# Patient Record
Sex: Male | Born: 1950
Health system: Southern US, Community
[De-identification: ages and names within clinical notes are randomized; demographics above are authoritative.]

## PROBLEM LIST (undated history)

## (undated) DIAGNOSIS — M545 Low back pain, unspecified: Secondary | ICD-10-CM

## (undated) DIAGNOSIS — Z720 Tobacco use: Secondary | ICD-10-CM

## (undated) DIAGNOSIS — R55 Syncope and collapse: Secondary | ICD-10-CM

## (undated) DIAGNOSIS — I2102 ST elevation (STEMI) myocardial infarction involving left anterior descending coronary artery: Secondary | ICD-10-CM

## (undated) DIAGNOSIS — R51 Headache: Secondary | ICD-10-CM

## (undated) DIAGNOSIS — I5042 Chronic combined systolic (congestive) and diastolic (congestive) heart failure: Secondary | ICD-10-CM

## (undated) DIAGNOSIS — J189 Pneumonia, unspecified organism: Secondary | ICD-10-CM

## (undated) DIAGNOSIS — Z95820 Peripheral vascular angioplasty status with implants and grafts: Secondary | ICD-10-CM

## (undated) DIAGNOSIS — R519 Headache, unspecified: Secondary | ICD-10-CM

## (undated) DIAGNOSIS — K635 Polyp of colon: Secondary | ICD-10-CM

## (undated) DIAGNOSIS — Z9861 Coronary angioplasty status: Principal | ICD-10-CM

## (undated) DIAGNOSIS — R739 Hyperglycemia, unspecified: Secondary | ICD-10-CM

## (undated) DIAGNOSIS — N200 Calculus of kidney: Secondary | ICD-10-CM

## (undated) DIAGNOSIS — I255 Ischemic cardiomyopathy: Secondary | ICD-10-CM

## (undated) DIAGNOSIS — I472 Ventricular tachycardia, unspecified: Secondary | ICD-10-CM

## (undated) DIAGNOSIS — I209 Angina pectoris, unspecified: Secondary | ICD-10-CM

## (undated) DIAGNOSIS — I2510 Atherosclerotic heart disease of native coronary artery without angina pectoris: Secondary | ICD-10-CM

## (undated) DIAGNOSIS — M1990 Unspecified osteoarthritis, unspecified site: Secondary | ICD-10-CM

## (undated) DIAGNOSIS — E785 Hyperlipidemia, unspecified: Secondary | ICD-10-CM

## (undated) DIAGNOSIS — G8929 Other chronic pain: Secondary | ICD-10-CM

## (undated) DIAGNOSIS — I241 Dressler's syndrome: Secondary | ICD-10-CM

## (undated) HISTORY — PX: VASECTOMY: SHX75

## (undated) HISTORY — DX: Coronary angioplasty status: Z98.61

## (undated) HISTORY — PX: TRANSTHORACIC ECHOCARDIOGRAM: SHX275

## (undated) HISTORY — DX: Atherosclerotic heart disease of native coronary artery without angina pectoris: I25.10

## (undated) HISTORY — DX: ST elevation (STEMI) myocardial infarction involving left anterior descending coronary artery: I21.02

## (undated) HISTORY — DX: Ventricular tachycardia, unspecified: I47.20

## (undated) HISTORY — PX: INGUINAL HERNIA REPAIR: SUR1180

## (undated) HISTORY — DX: Hyperlipidemia, unspecified: E78.5

## (undated) HISTORY — DX: Polyp of colon: K63.5

## (undated) HISTORY — DX: Ventricular tachycardia: I47.2

---

## 2003-11-03 ENCOUNTER — Ambulatory Visit (HOSPITAL_COMMUNITY): Admission: RE | Admit: 2003-11-03 | Discharge: 2003-11-03 | Payer: Self-pay | Admitting: Gastroenterology

## 2003-11-03 ENCOUNTER — Encounter (INDEPENDENT_AMBULATORY_CARE_PROVIDER_SITE_OTHER): Payer: Self-pay | Admitting: *Deleted

## 2005-03-25 ENCOUNTER — Ambulatory Visit: Payer: Self-pay | Admitting: Oncology

## 2005-06-17 ENCOUNTER — Ambulatory Visit: Payer: Self-pay | Admitting: Oncology

## 2005-08-12 ENCOUNTER — Ambulatory Visit: Payer: Self-pay | Admitting: Oncology

## 2005-10-09 ENCOUNTER — Ambulatory Visit: Payer: Self-pay | Admitting: Oncology

## 2006-04-06 ENCOUNTER — Ambulatory Visit: Payer: Self-pay | Admitting: Oncology

## 2006-04-11 LAB — COMPREHENSIVE METABOLIC PANEL
ALT: 25 U/L (ref 0–40)
AST: 21 U/L (ref 0–37)
Albumin: 4.3 g/dL (ref 3.5–5.2)
Alkaline Phosphatase: 95 U/L (ref 39–117)
Calcium: 9.1 mg/dL (ref 8.4–10.5)
Creatinine, Ser: 1.1 mg/dL (ref 0.4–1.5)
Glucose, Bld: 106 mg/dL — ABNORMAL HIGH (ref 70–99)
Potassium: 4.3 mEq/L (ref 3.5–5.3)
Sodium: 138 mEq/L (ref 135–145)
Total Bilirubin: 0.3 mg/dL (ref 0.3–1.2)
Total Protein: 6.4 g/dL (ref 6.0–8.3)

## 2006-04-11 LAB — CHCC SMEAR

## 2006-04-11 LAB — CBC WITH DIFFERENTIAL/PLATELET
EOS%: 1.8 % (ref 0.0–7.0)
HCT: 39.9 % (ref 38.7–49.9)
MCH: 31.7 pg (ref 28.0–33.4)
MCV: 92.8 fL (ref 81.6–98.0)
NEUT#: 4.1 10*3/uL (ref 1.5–6.5)
NEUT%: 45.8 % (ref 40.0–75.0)
RBC: 4.3 10*6/uL (ref 4.20–5.71)
WBC: 8.9 10*3/uL (ref 4.0–10.0)

## 2006-04-11 LAB — LACTATE DEHYDROGENASE: LDH: 152 U/L (ref 94–250)

## 2010-07-02 ENCOUNTER — Encounter: Admission: RE | Admit: 2010-07-02 | Discharge: 2010-07-02 | Payer: Self-pay | Admitting: Gastroenterology

## 2010-07-08 LAB — HM COLONOSCOPY: HM Colonoscopy: NORMAL

## 2011-04-01 NOTE — Op Note (Signed)
NAME:  Roger [name], Roger [name]                       ACCOUNT NO.:  [id number]   MEDICAL RECORD NO.:  [id number]                   PATIENT TYPE:  AMB   LOCATION:  ENDO                                 FACILITY:  [facility]   PHYSICIAN:  Graylin Shiver, M.D.                DATE OF BIRTH:  [birth year]   DATE OF PROCEDURE:  11/03/2003  DATE OF DISCHARGE:                                 OPERATIVE REPORT   PROCEDURE PERFORMED:  Colonoscopy with biopsy.   INDICATIONS FOR PROCEDURE:  Screening.   Informed consent was obtained after explanation of the risks of bleeding,  infection, and perforation.   PREMEDICATIONS:  Fentanyl 75 mcg  IV, Versed 6 mg IV.   DESCRIPTION OF PROCEDURE:  With the patient in the left lateral decubitus  position, a rectal exam was performed and no masses were felt.  The Olympus  colonoscope was inserted into the rectum and advanced around the colon to  the cecum.  Cecal landmarks were identified.  The cecum and ascending colon  were normal.  The transverse colon normal.  The descending colon and sigmoid  were normal and in the proximal rectum there was a small 3 mm sessile polyp  removed with cold forceps.  The patient tolerated the procedure well without  complications.   IMPRESSION:  Rectal polyp diagnosis code 211.4.   PLAN:  The pathology will be checked.                                               Graylin Shiver, M.D.    Roger [name]  D:  11/03/2003  T:  11/04/2003  Job:  [id number]   cc:   Ernestina Penna, M.D.  [address] [place]  [place] [address]  Fax: [phone]

## 2012-04-01 ENCOUNTER — Encounter (HOSPITAL_COMMUNITY): Payer: Self-pay | Admitting: *Deleted

## 2012-04-01 ENCOUNTER — Emergency Department (HOSPITAL_COMMUNITY)
Admission: EM | Admit: 2012-04-01 | Discharge: 2012-04-01 | Disposition: A | Discharge status: Home / Self Care | Payer: BC Managed Care – PPO | Attending: Emergency Medicine | Admitting: Emergency Medicine

## 2012-04-01 ENCOUNTER — Emergency Department (HOSPITAL_COMMUNITY): Payer: BC Managed Care – PPO

## 2012-04-01 DIAGNOSIS — N1330 Unspecified hydronephrosis: Secondary | ICD-10-CM | POA: Insufficient documentation

## 2012-04-01 DIAGNOSIS — N200 Calculus of kidney: Secondary | ICD-10-CM

## 2012-04-01 DIAGNOSIS — R110 Nausea: Secondary | ICD-10-CM | POA: Insufficient documentation

## 2012-04-01 DIAGNOSIS — R109 Unspecified abdominal pain: Secondary | ICD-10-CM | POA: Insufficient documentation

## 2012-04-01 DIAGNOSIS — N201 Calculus of ureter: Secondary | ICD-10-CM | POA: Insufficient documentation

## 2012-04-01 DIAGNOSIS — R6883 Chills (without fever): Secondary | ICD-10-CM | POA: Insufficient documentation

## 2012-04-01 HISTORY — DX: Calculus of kidney: N20.0

## 2012-04-01 LAB — COMPREHENSIVE METABOLIC PANEL
BUN: 15 mg/dL (ref 6–23)
Calcium: 9.2 mg/dL (ref 8.4–10.5)
Chloride: 100 mEq/L (ref 96–112)
GFR calc non Af Amer: 74 mL/min — ABNORMAL LOW (ref >90)
Glucose, Bld: 153 mg/dL — ABNORMAL HIGH (ref 70–99)
Sodium: 136 mEq/L (ref 135–145)

## 2012-04-01 LAB — URINE MICROSCOPIC-ADD ON

## 2012-04-01 LAB — URINALYSIS, ROUTINE W REFLEX MICROSCOPIC
Ketones, ur: NEGATIVE mg/dL
Leukocytes, UA: NEGATIVE
Nitrite: NEGATIVE
Protein, ur: NEGATIVE mg/dL

## 2012-04-01 LAB — CBC
HCT: 39.7 % (ref 39.0–52.0)
MCHC: 34.8 g/dL (ref 30.0–36.0)
Platelets: 239 10*3/uL (ref 150–400)
RDW: 12.4 % (ref 11.5–15.5)

## 2012-04-01 MED ORDER — KETOROLAC TROMETHAMINE 30 MG/ML IJ SOLN
30.0000 mg | Freq: Once | INTRAMUSCULAR | Status: AC
  Administered 2012-04-01: 30 mg via INTRAVENOUS
  Filled 2012-04-01: qty 1

## 2012-04-01 MED ORDER — ONDANSETRON HCL 4 MG PO TABS: 4.0000 mg | ORAL_TABLET | Freq: Four times a day (QID) | ORAL | Status: AC | PRN

## 2012-04-01 MED ORDER — TAMSULOSIN HCL 0.4 MG PO CAPS: 0.4000 mg | ORAL_CAPSULE | Freq: Every day | ORAL | Status: DC

## 2012-04-01 MED ORDER — SODIUM CHLORIDE 0.9 % IV BOLUS (SEPSIS)
500.0000 mL | Freq: Once | INTRAVENOUS | Status: AC
  Administered 2012-04-01: 500 mL via INTRAVENOUS

## 2012-04-01 MED ORDER — IBUPROFEN 600 MG PO TABS: 600.0000 mg | ORAL_TABLET | Freq: Four times a day (QID) | ORAL | Status: AC | PRN

## 2012-04-01 MED ORDER — HYDROMORPHONE HCL PF 1 MG/ML IJ SOLN
1.0000 mg | Freq: Once | INTRAMUSCULAR | Status: AC
  Administered 2012-04-01: 1 mg via INTRAVENOUS
  Filled 2012-04-01: qty 1

## 2012-04-01 MED ORDER — OXYCODONE-ACETAMINOPHEN 5-325 MG PO TABS: 1.0000 | ORAL_TABLET | Freq: Four times a day (QID) | ORAL | Status: AC | PRN

## 2012-04-01 MED ORDER — ONDANSETRON HCL 4 MG/2ML IJ SOLN
4.0000 mg | Freq: Once | INTRAMUSCULAR | Status: AC
  Administered 2012-04-01: 4 mg via INTRAVENOUS
  Filled 2012-04-01: qty 2

## 2012-04-01 NOTE — ED Notes (Signed)
Patient states he was awaken with left flank pain . States he went to the batrhroom to urinate and that's when the pain started. States he had a kidney stone 22 yrs. Ago.

## 2012-04-01 NOTE — Discharge Instructions (Signed)
Please strain all of your urine to try to catch the stone. Follow-up with your urologist.  Follow-up with your regular doctor regarding the other CT findings as we discussed, specifically the liver.      Kidney Stones Kidney stones (ureteral lithiasis) are deposits that form inside your kidneys. The intense pain is caused by the stone moving through the urinary tract. When the stone moves, the ureter goes into spasm around the stone. The stone is usually passed in the urine.  CAUSES   A disorder that makes certain neck glands produce too much parathyroid hormone (primary hyperparathyroidism).   A buildup of uric acid crystals.   Narrowing (stricture) of the ureter.   A kidney obstruction present at birth (congenital obstruction).   Previous surgery on the kidney or ureters.   Numerous kidney infections.  SYMPTOMS   Feeling sick to your stomach (nauseous).   Throwing up (vomiting).   Blood in the urine (hematuria).   Pain that usually spreads (radiates) to the groin.   Frequency or urgency of urination.  DIAGNOSIS   Taking a history and physical exam.   Blood or urine tests.   Computerized X-ray scan (CT scan).   Occasionally, an examination of the inside of the urinary bladder (cystoscopy) is performed.  TREATMENT   Observation.   Increasing your fluid intake.   Surgery may be needed if you have severe pain or persistent obstruction.  The size, location, and chemical composition are all important variables that will determine the proper choice of action for you. Talk to your caregiver to better understand your situation so that you will minimize the risk of injury to yourself and your kidney.  HOME CARE INSTRUCTIONS   Drink enough water and fluids to keep your urine clear or pale yellow.   Strain all urine through the provided strainer. Keep all particulate matter and stones for your caregiver to see. The stone causing the pain may be as small as a grain of  salt. It is very important to use the strainer each and every time you pass your urine. The collection of your stone will allow your caregiver to analyze it and verify that a stone has actually passed.   Only take over-the-counter or prescription medicines for pain, discomfort, or fever as directed by your caregiver.   Make a follow-up appointment with your caregiver as directed.   Get follow-up X-rays if required. The absence of pain does not always mean that the stone has passed. It may have only stopped moving. If the urine remains completely obstructed, it can cause loss of kidney function or even complete destruction of the kidney. It is your responsibility to make sure X-rays and follow-ups are completed. Ultrasounds of the kidney can show blockages and the status of the kidney. Ultrasounds are not associated with any radiation and can be performed easily in a matter of minutes.  SEEK IMMEDIATE MEDICAL CARE IF:   Pain cannot be controlled with the prescribed medicine.   You have a fever.   The severity or intensity of pain increases over 18 hours and is not relieved by pain medicine.   You develop a new onset of abdominal pain.   You feel faint or pass out.  MAKE SURE YOU:   Understand these instructions.   Will watch your condition.   Will get help right away if you are not doing well or get worse.  Document Released: 10/31/2005 Document Revised: 10/20/2011 Document Reviewed: 02/26/2010 [facility] Patient Information 2012 [place], [place].

## 2012-04-01 NOTE — ED Notes (Signed)
Pt sts at 0400 got up to urinate and developed  Left lower abd and back pain.  Awaiting urine sample

## 2012-04-01 NOTE — ED Provider Notes (Signed)
History     CSN: [id number]  Arrival date & time 04/01/12  0605   First MD Initiated Contact with Patient 04/01/12 [phone]      Chief Complaint  Patient presents with  . Flank Pain    (Consider location/radiation/quality/duration/timing/severity/associated sxs/prior treatment) Patient is a 61 y.o. male presenting with flank pain. The history is provided by the patient.  Flank Pain This is a new problem.  Sudden onset while urinating this morning severe, sharp left flank pain, rad to left lower abd and left side of groin (intermittently). Pain described as pressure in the back. Hx kidney stone 22 years ago, feels similar. + nausea, chills. Neg fever, vomiting, dysuria, hematuria, penile discharge, testicular swelling, bowel habit change.  No aggravating or alleviating factors, no tx attempted PTA.  Past Medical History  Diagnosis Date  . Kidney calculus     History reviewed. No pertinent past surgical history.  History reviewed. No pertinent family history.  History  Substance Use Topics  . Smoking status: Current Everyday Smoker  . Smokeless tobacco: Not on file  . Alcohol Use: Yes      Review of Systems  Genitourinary: Positive for flank pain.  10 systems reviewed and are otherwise negative for acute change except as noted in the HPI.   Allergies  Review of patient's allergies indicates no known allergies.  Home Medications   Current Outpatient Rx  Name Route Sig Dispense Refill  . ASPIRIN 325 MG PO TABS Oral Take 325 mg by mouth daily.    Marland Kitchen SIMVASTATIN 40 MG PO TABS Oral Take 40 mg by mouth every evening.    Marland Kitchen VARENICLINE TARTRATE 1 MG PO TABS Oral Take 1 mg by mouth 2 (two) times daily.      BP 136/63  Pulse 89  Temp(Src) 97.8 F (36.6 C) (Oral)  Resp 20  SpO2 97%  Physical Exam  Nursing note reviewed. Constitutional: He appears well-developed and well-nourished. Distressed: uncomfortable appearing.       VS reviewed, sig for HTN  HENT:  Head:  Normocephalic and atraumatic.       Oral mucosa moist  Eyes: Conjunctivae are normal. Pupils are equal, round, and reactive to light.  Neck: Neck supple.  Cardiovascular: Normal rate, regular rhythm and normal heart sounds.        Bilateral radial and DP pulses are 2+   Pulmonary/Chest: Effort normal and breath sounds normal. No respiratory distress. He has no wheezes.  Abdominal: Soft. Bowel sounds are normal. He exhibits no distension. There is Tenderness: mild left-sided abd tenderness.. There is no guarding.       Slight left CVAT  Musculoskeletal: He exhibits no edema.  Neurological: He is alert.       MS appears baseline for pt and situation  Skin: Skin is warm and dry.    ED Course  Procedures (including critical care time)  Labs Reviewed  URINALYSIS, ROUTINE W REFLEX MICROSCOPIC - Abnormal; Notable for the following:    Hgb urine dipstick LARGE (*)    All other components within normal limits  CBC - Abnormal; Notable for the following:    WBC 11.1 (*)    All other components within normal limits  COMPREHENSIVE METABOLIC PANEL - Abnormal; Notable for the following:    Glucose, Bld 153 (*)    GFR calc non Af Amer 74 (*)    GFR calc Af Amer 86 (*)    All other components within normal limits  URINE MICROSCOPIC-ADD ON  Ct Abdomen Pelvis Wo Contrast  04/01/2012  *RADIOLOGY REPORT*  Clinical Data: Left-sided flank pain.  CT ABDOMEN AND PELVIS WITHOUT CONTRAST  Technique:  Multidetector CT imaging of the abdomen and pelvis was performed following the standard protocol without intravenous contrast.  Comparison: No priors.  Findings:  Lung Bases: Dependent atelectasis in the right lower lobe. Otherwise, unremarkable.  Abdomen/Pelvis:  Image 82 of series 2 demonstrates a 3 mm partially obstructive calculus in the distal third of the left ureter (immediately proximal to the left ureterovesicular junction) with mild proximal hydroureteronephrosis and extensive left-sided perinephric  stranding.  A 4 mm nonobstructive calculus is also noted in the lower pole collecting system of the right kidney.  The kidneys are otherwise unremarkable on this noncontrast examination.  A 7 mm low attenuation lesion is noted in segment 6 of the liver (image 24 series 2), which is too small to definitively characterize.  The unenhanced appearance of the low remainder of the liver, gallbladder, pancreas, spleen and bilateral adrenal glands is otherwise unremarkable.  Normal appendix.  No ascites or pneumoperitoneum and no pathologic distension of bowel.  There are a few colonic diverticula, without surrounding inflammatory changes to suggest acute diverticulitis at this time.  No definite pathologic adenopathy identified within the abdomen or pelvis on this noncontrast CT examination.  Atherosclerosis throughout the abdominal and pelvic vasculature, without definite aneurysm. Urinary bladder is unremarkable.  A large coarse calcification is noted in the central aspect of the prostate gland.  Musculoskeletal: There are bilateral pars defects at L5 with 11 mm of anterolisthesis of L5 upon S1. Additionally, there is a compression fracture of T11 which appears to be old with approximately 30% loss of anterior vertebral body height.  There are no aggressive appearing lytic or blastic lesions noted in the visualized portions of the skeleton.  IMPRESSION: 1.  3 mm partially obstructive calculus in the distal third of the left ureter with mild proximal hydroureteronephrosis and extensive left perinephric stranding. 2.  4 mm nonobstructive calculus in the lower pole collecting system of the right kidney. 3.  7 mm low attenuation lesion in segment 6 of the liver is too small to definitively characterize, particularly on this noncontrast CT examination.  However, this is statistically favored to represent a small cyst or biliary hamartoma. 4.  Grade II spondylolisthesis of L5-S1, as above. 5.  Mild colonic diverticulosis  without evidence to suggest acute diverticulitis. 6.  Atherosclerosis. 7.  Old compression fracture of T11 with approximately 30% loss of anterior vertebral body height.  Original Report Authenticated By: Florencia Reasons, M.D.     1. Kidney stone on left side       MDM  On initial examination, suspicion for ureteral colic but abd pathology to include diverticulitis is considered. Hemodynamically stable. Re-eval demonstrates no TTP in abdomen, but continued left flank pain making ureteral colic more likely, will proceed with CT abd/pelvis without contrast.      11:35 AM Please see CT scan result summary above. All findings have been discussed with pt and spouse. L5-S1 spondylolisthesis, diverticulosis, T11 compression fx are chronic and known to pt. Pain sig improved with medications in ED. No UTI. Will d.c home with pain, nausea, flomax rx. Urology f.u discussed, pt has seen Tannenbaum in the past.  Shaaron Adler, PA-C 04/01/12 1140

## 2012-04-03 NOTE — ED Provider Notes (Signed)
Medical screening examination/treatment/procedure(s) were performed by non-physician practitioner and as supervising physician I was immediately available for consultation/collaboration.  Geoffery Lyons, MD 04/03/12 [phone]

## 2013-02-11 ENCOUNTER — Other Ambulatory Visit (INDEPENDENT_AMBULATORY_CARE_PROVIDER_SITE_OTHER): Payer: BC Managed Care – PPO

## 2013-02-11 ENCOUNTER — Other Ambulatory Visit: Payer: Self-pay | Admitting: Family Medicine

## 2013-02-11 DIAGNOSIS — Z0000 Encounter for general adult medical examination without abnormal findings: Secondary | ICD-10-CM

## 2013-02-11 DIAGNOSIS — E559 Vitamin D deficiency, unspecified: Secondary | ICD-10-CM

## 2013-02-11 DIAGNOSIS — E291 Testicular hypofunction: Secondary | ICD-10-CM

## 2013-02-11 DIAGNOSIS — E782 Mixed hyperlipidemia: Secondary | ICD-10-CM

## 2013-02-11 DIAGNOSIS — Z79899 Other long term (current) drug therapy: Secondary | ICD-10-CM

## 2013-02-11 LAB — COMPLETE METABOLIC PANEL WITH GFR
AST: 13 U/L (ref 0–37)
Albumin: 3.8 g/dL (ref 3.5–5.2)
Alkaline Phosphatase: 99 U/L (ref 39–117)
BUN: 14 mg/dL (ref 6–23)
CO2: 25 mEq/L (ref 19–32)
GFR, Est African American: 80 mL/min
Glucose, Bld: 106 mg/dL — ABNORMAL HIGH (ref 70–99)
Sodium: 141 mEq/L (ref 135–145)
Total Protein: 5.9 g/dL — ABNORMAL LOW (ref 6.0–8.3)

## 2013-02-11 LAB — CBC WITH DIFFERENTIAL/PLATELET
Basophils Absolute: 0.1 10*3/uL (ref 0.0–0.1)
Hemoglobin: 14.8 g/dL (ref 13.0–17.0)
Lymphocytes Relative: 37 % (ref 12–46)
Lymphs Abs: 3.5 10*3/uL (ref 0.7–4.0)
MCH: 31.2 pg (ref 26.0–34.0)
MCHC: 34.5 g/dL (ref 30.0–36.0)
MCV: 90.3 fL (ref 78.0–100.0)
Monocytes Absolute: 0.8 10*3/uL (ref 0.1–1.0)
RDW: 13.3 % (ref 11.5–15.5)

## 2013-02-11 LAB — LIPID PANEL
LDL Cholesterol: 65 mg/dL (ref 0–99)
Total CHOL/HDL Ratio: 4.1 Ratio
VLDL: 43 mg/dL — ABNORMAL HIGH (ref 0–40)

## 2013-02-11 LAB — TSH: TSH: 3.445 u[IU]/mL (ref 0.350–4.500)

## 2013-02-11 LAB — TESTOSTERONE: Testosterone: 232 ng/dL — ABNORMAL LOW (ref 300–890)

## 2013-02-11 LAB — PSA: PSA: 0.6 ng/mL (ref <=4.00)

## 2013-02-22 ENCOUNTER — Ambulatory Visit (INDEPENDENT_AMBULATORY_CARE_PROVIDER_SITE_OTHER): Payer: BC Managed Care – PPO | Admitting: Family Medicine

## 2013-02-22 ENCOUNTER — Encounter: Payer: Self-pay | Admitting: Family Medicine

## 2013-02-22 VITALS — BP 132/74 | HR 66 | Temp 97.8°F | Resp 18 | Ht 70.0 in | Wt 214.0 lb

## 2013-02-22 DIAGNOSIS — K635 Polyp of colon: Secondary | ICD-10-CM | POA: Insufficient documentation

## 2013-02-22 DIAGNOSIS — Z720 Tobacco use: Secondary | ICD-10-CM | POA: Insufficient documentation

## 2013-02-22 DIAGNOSIS — E785 Hyperlipidemia, unspecified: Secondary | ICD-10-CM | POA: Insufficient documentation

## 2013-02-22 DIAGNOSIS — Z0000 Encounter for general adult medical examination without abnormal findings: Secondary | ICD-10-CM

## 2013-02-22 DIAGNOSIS — N200 Calculus of kidney: Secondary | ICD-10-CM | POA: Insufficient documentation

## 2013-02-22 NOTE — Progress Notes (Signed)
Subjective:    Patient ID: Roger [name], male    DOB: [birth year], 62 y.o.   MRN: [id number]  HPI  Patient is here for complete physical exam.  He reports chronic daily back pain which is due to spondylolysis in his L-spine.  He also has fatigue and deconditioning/shortness of breath due to smoking.  He is currently not interested in smoking cessation.  Past Medical History  Diagnosis Date  . Kidney calculus   . Hyperlipidemia   . Colon polyp   . Smoker    Current Outpatient Prescriptions on File Prior to Visit  Medication Sig Dispense Refill  . aspirin 325 MG tablet Take 325 mg by mouth daily.      . simvastatin (ZOCOR) 40 MG tablet Take 40 mg by mouth every evening.       No current facility-administered medications on file prior to visit.   No Known Allergies History   Social History  . Marital Status: Married    Spouse Name: N/A    Number of Children: N/A  . Years of Education: N/A   Occupational History  . Not on file.   Social History Main Topics  . Smoking status: Current Every Day Smoker  . Smokeless tobacco: Never Used  . Alcohol Use: Yes  . Drug Use: No  . Sexually Active: Not on file     Comment: married   Other Topics Concern  . Not on file   Social History Narrative  . No narrative on file   Family History  Problem Relation Age of Onset  . Aneurysm Mother   . Dementia Father   . Dystonia Sister      Review of Systems  All other systems reviewed and are negative.       Objective:   Physical Exam  Constitutional: He is oriented to person, place, and time. He appears well-developed and well-nourished.  HENT:  Head: Normocephalic and atraumatic.  Right Ear: External ear normal.  Left Ear: External ear normal.  Mouth/Throat: Oropharynx is clear and moist.  Eyes: Conjunctivae and EOM are normal. Pupils are equal, round, and reactive to light.  Neck: Normal range of motion. Neck supple. No JVD present. No tracheal deviation present. No  thyromegaly present.  Cardiovascular: Normal rate, regular rhythm, normal heart sounds and intact distal pulses.  Exam reveals no gallop and no friction rub.   No murmur heard. Pulmonary/Chest: Breath sounds normal. No respiratory distress. He has no wheezes. He has no rales. He exhibits no tenderness.  Abdominal: Soft. Bowel sounds are normal. He exhibits no distension and no mass. There is no tenderness. There is no rebound and no guarding.  Genitourinary: Prostate normal and penis normal. Left testis is undescended. No penile tenderness.  Musculoskeletal: Normal range of motion. He exhibits tenderness. He exhibits no edema.  Lymphadenopathy:    He has no cervical adenopathy.  Neurological: He is alert and oriented to person, place, and time. He has normal reflexes. He displays normal reflexes. No cranial nerve deficit. He exhibits normal muscle tone. Coordination normal.  Skin: Skin is warm and dry. No rash noted. No erythema. No pallor.  Psychiatric: He has a normal mood and affect. His behavior is normal. Judgment and thought content normal.          Assessment & Plan:  Routine general medical examination at a health care facility  I recommended to the patient diet exercise and weight loss. Eyes are strong recommended smoking cessation. Reviewed his CBC, CMP, fasting  lipid panel, TSH, and PSA. These were significant for a glucose of 106 and an HDL of 35. We discussed at length therapeutic lifestyle changes to address both of these concerns. Follow up in 6 months. Recommended Chantix for smoking cessation.

## 2013-08-07 ENCOUNTER — Telehealth: Payer: Self-pay | Admitting: Family Medicine

## 2013-08-07 MED ORDER — SIMVASTATIN 40 MG PO TABS: 40.0000 mg | ORAL_TABLET | Freq: Every evening | ORAL | Status: DC

## 2013-08-07 NOTE — Telephone Encounter (Signed)
Rx Refilled  

## 2013-08-07 NOTE — Telephone Encounter (Signed)
Simvastatin 40 mg tab 1 QHS #90 °

## 2014-05-26 ENCOUNTER — Ambulatory Visit
Admission: RE | Admit: 2014-05-26 | Discharge: 2014-05-26 | Disposition: A | Discharge status: Home / Self Care | Payer: BC Managed Care – PPO | Source: Ambulatory Visit | Attending: Family Medicine | Admitting: Family Medicine

## 2014-05-26 ENCOUNTER — Other Ambulatory Visit: Payer: Self-pay | Admitting: Family Medicine

## 2014-05-26 DIAGNOSIS — M170 Bilateral primary osteoarthritis of knee: Secondary | ICD-10-CM

## 2014-05-26 DIAGNOSIS — M25562 Pain in left knee: Secondary | ICD-10-CM

## 2014-05-26 DIAGNOSIS — M25561 Pain in right knee: Secondary | ICD-10-CM

## 2014-11-14 DIAGNOSIS — J189 Pneumonia, unspecified organism: Secondary | ICD-10-CM

## 2014-11-14 HISTORY — DX: Pneumonia, unspecified organism: J18.9

## 2015-04-20 ENCOUNTER — Encounter: Payer: Self-pay | Admitting: Family Medicine

## 2015-11-04 ENCOUNTER — Encounter (HOSPITAL_COMMUNITY)
Admission: EM | Disposition: A | Discharge status: Home / Self Care | Payer: BLUE CROSS/BLUE SHIELD | Source: Home / Self Care | Attending: Cardiovascular Disease

## 2015-11-04 ENCOUNTER — Inpatient Hospital Stay (HOSPITAL_COMMUNITY)
Admission: EM | Admit: 2015-11-04 | Discharge: 2015-11-08 | DRG: 246 | Disposition: A | Discharge status: Home / Self Care | Payer: BLUE CROSS/BLUE SHIELD | Attending: Cardiovascular Disease | Admitting: Cardiovascular Disease

## 2015-11-04 ENCOUNTER — Other Ambulatory Visit: Payer: Self-pay

## 2015-11-04 ENCOUNTER — Encounter (HOSPITAL_COMMUNITY): Payer: Self-pay | Admitting: *Deleted

## 2015-11-04 DIAGNOSIS — Z79899 Other long term (current) drug therapy: Secondary | ICD-10-CM | POA: Diagnosis not present

## 2015-11-04 DIAGNOSIS — I2109 ST elevation (STEMI) myocardial infarction involving other coronary artery of anterior wall: Secondary | ICD-10-CM | POA: Diagnosis not present

## 2015-11-04 DIAGNOSIS — J40 Bronchitis, not specified as acute or chronic: Secondary | ICD-10-CM | POA: Diagnosis present

## 2015-11-04 DIAGNOSIS — Z7982 Long term (current) use of aspirin: Secondary | ICD-10-CM

## 2015-11-04 DIAGNOSIS — R0600 Dyspnea, unspecified: Secondary | ICD-10-CM

## 2015-11-04 DIAGNOSIS — I2102 ST elevation (STEMI) myocardial infarction involving left anterior descending coronary artery: Secondary | ICD-10-CM

## 2015-11-04 DIAGNOSIS — I255 Ischemic cardiomyopathy: Secondary | ICD-10-CM | POA: Diagnosis present

## 2015-11-04 DIAGNOSIS — E785 Hyperlipidemia, unspecified: Secondary | ICD-10-CM | POA: Diagnosis present

## 2015-11-04 DIAGNOSIS — I4901 Ventricular fibrillation: Secondary | ICD-10-CM

## 2015-11-04 DIAGNOSIS — I472 Ventricular tachycardia, unspecified: Secondary | ICD-10-CM

## 2015-11-04 DIAGNOSIS — I252 Old myocardial infarction: Secondary | ICD-10-CM | POA: Diagnosis present

## 2015-11-04 DIAGNOSIS — D72829 Elevated white blood cell count, unspecified: Secondary | ICD-10-CM

## 2015-11-04 DIAGNOSIS — R0602 Shortness of breath: Secondary | ICD-10-CM | POA: Diagnosis not present

## 2015-11-04 DIAGNOSIS — I110 Hypertensive heart disease with heart failure: Secondary | ICD-10-CM | POA: Diagnosis present

## 2015-11-04 DIAGNOSIS — I5042 Chronic combined systolic (congestive) and diastolic (congestive) heart failure: Secondary | ICD-10-CM

## 2015-11-04 DIAGNOSIS — Z9861 Coronary angioplasty status: Secondary | ICD-10-CM

## 2015-11-04 DIAGNOSIS — I5043 Acute on chronic combined systolic (congestive) and diastolic (congestive) heart failure: Secondary | ICD-10-CM | POA: Diagnosis present

## 2015-11-04 DIAGNOSIS — I2510 Atherosclerotic heart disease of native coronary artery without angina pectoris: Secondary | ICD-10-CM

## 2015-11-04 DIAGNOSIS — I241 Dressler's syndrome: Secondary | ICD-10-CM | POA: Diagnosis present

## 2015-11-04 DIAGNOSIS — Z720 Tobacco use: Secondary | ICD-10-CM | POA: Diagnosis not present

## 2015-11-04 DIAGNOSIS — F17200 Nicotine dependence, unspecified, uncomplicated: Secondary | ICD-10-CM | POA: Diagnosis present

## 2015-11-04 DIAGNOSIS — R739 Hyperglycemia, unspecified: Secondary | ICD-10-CM

## 2015-11-04 DIAGNOSIS — I213 ST elevation (STEMI) myocardial infarction of unspecified site: Secondary | ICD-10-CM

## 2015-11-04 DIAGNOSIS — R079 Chest pain, unspecified: Secondary | ICD-10-CM | POA: Diagnosis present

## 2015-11-04 HISTORY — PX: CARDIAC CATHETERIZATION: SHX172

## 2015-11-04 HISTORY — DX: Atherosclerotic heart disease of native coronary artery without angina pectoris: I25.10

## 2015-11-04 HISTORY — DX: ST elevation (STEMI) myocardial infarction involving left anterior descending coronary artery: I21.02

## 2015-11-04 HISTORY — DX: Hyperglycemia, unspecified: R73.9

## 2015-11-04 HISTORY — DX: Dressler's syndrome: I24.1

## 2015-11-04 HISTORY — DX: Coronary angioplasty status: Z98.61

## 2015-11-04 HISTORY — DX: Ischemic cardiomyopathy: I25.5

## 2015-11-04 HISTORY — DX: Tobacco use: Z72.0

## 2015-11-04 HISTORY — DX: Chronic combined systolic (congestive) and diastolic (congestive) heart failure: I50.42

## 2015-11-04 LAB — COMPREHENSIVE METABOLIC PANEL
ALBUMIN: 3.9 g/dL (ref 3.5–5.0)
ALBUMIN: 3.5 g/dL (ref 3.5–5.0)
ALK PHOS: 100 U/L (ref 38–126)
ALT: 27 U/L (ref 17–63)
ALT: 25 U/L (ref 17–63)
ANION GAP: 9 (ref 5–15)
ANION GAP: 10 (ref 5–15)
AST: 23 U/L (ref 15–41)
AST: 29 U/L (ref 15–41)
Alkaline Phosphatase: 106 U/L (ref 38–126)
BILIRUBIN TOTAL: 0.4 mg/dL (ref 0.3–1.2)
BUN: 14 mg/dL (ref 6–20)
BUN: 14 mg/dL (ref 6–20)
CALCIUM: 8.7 mg/dL — AB (ref 8.9–10.3)
CHLORIDE: 107 mmol/L (ref 101–111)
CO2: 25 mmol/L (ref 22–32)
CO2: 22 mmol/L (ref 22–32)
Calcium: 9.2 mg/dL (ref 8.9–10.3)
Chloride: 108 mmol/L (ref 101–111)
Creatinine, Ser: 1.29 mg/dL — ABNORMAL HIGH (ref 0.61–1.24)
Creatinine, Ser: 1.18 mg/dL (ref 0.61–1.24)
GFR calc Af Amer: >60 mL/min (ref >60)
GFR calc Af Amer: >60 mL/min (ref >60)
GFR calc non Af Amer: >60 mL/min (ref >60)
GFR, EST NON AFRICAN AMERICAN: 57 mL/min — AB (ref >60)
GLUCOSE: 119 mg/dL — AB (ref 65–99)
Glucose, Bld: 117 mg/dL — ABNORMAL HIGH (ref 65–99)
POTASSIUM: 4.2 mmol/L (ref 3.5–5.1)
POTASSIUM: 4.9 mmol/L (ref 3.5–5.1)
SODIUM: 140 mmol/L (ref 135–145)
Sodium: 141 mmol/L (ref 135–145)
TOTAL PROTEIN: 6.2 g/dL — AB (ref 6.5–8.1)
Total Bilirubin: 0.8 mg/dL (ref 0.3–1.2)
Total Protein: 5.7 g/dL — ABNORMAL LOW (ref 6.5–8.1)

## 2015-11-04 LAB — CBC
HEMATOCRIT: 44.7 % (ref 39.0–52.0)
Hemoglobin: 15.1 g/dL (ref 13.0–17.0)
MCH: 31.1 pg (ref 26.0–34.0)
MCHC: 33.8 g/dL (ref 30.0–36.0)
MCV: 92.2 fL (ref 78.0–100.0)
PLATELETS: 275 10*3/uL (ref 150–400)
RBC: 4.85 MIL/uL (ref 4.22–5.81)
RDW: 12.9 % (ref 11.5–15.5)
WBC: 12.3 10*3/uL — AB (ref 4.0–10.5)

## 2015-11-04 LAB — CBC WITH DIFFERENTIAL/PLATELET
BASOS ABS: 0.1 10*3/uL (ref 0.0–0.1)
Basophils Relative: 0 %
Eosinophils Absolute: 0.2 10*3/uL (ref 0.0–0.7)
Eosinophils Relative: 1 %
HEMATOCRIT: 43.4 % (ref 39.0–52.0)
Hemoglobin: 14.7 g/dL (ref 13.0–17.0)
LYMPHS PCT: 21 %
Lymphs Abs: 2.8 10*3/uL (ref 0.7–4.0)
MCH: 31.2 pg (ref 26.0–34.0)
MCHC: 33.9 g/dL (ref 30.0–36.0)
MCV: 92.1 fL (ref 78.0–100.0)
Monocytes Absolute: 0.8 10*3/uL (ref 0.1–1.0)
Monocytes Relative: 6 %
NEUTROS ABS: 9.7 10*3/uL — AB (ref 1.7–7.7)
Neutrophils Relative %: 72 %
Platelets: 238 10*3/uL (ref 150–400)
RBC: 4.71 MIL/uL (ref 4.22–5.81)
RDW: 12.9 % (ref 11.5–15.5)
WBC: 13.6 10*3/uL — AB (ref 4.0–10.5)

## 2015-11-04 LAB — POCT I-STAT TROPONIN I: Troponin i, poc: 0.11 ng/mL (ref 0.00–0.08)

## 2015-11-04 LAB — POCT I-STAT, CHEM 8
BUN: 19 mg/dL (ref 6–20)
CALCIUM ION: 1.15 mmol/L (ref 1.13–1.30)
Chloride: 106 mmol/L (ref 101–111)
Creatinine, Ser: 1.2 mg/dL (ref 0.61–1.24)
Glucose, Bld: 110 mg/dL — ABNORMAL HIGH (ref 65–99)
HCT: 48 % (ref 39.0–52.0)
HEMOGLOBIN: 16.3 g/dL (ref 13.0–17.0)
Potassium: 4.1 mmol/L (ref 3.5–5.1)
SODIUM: 143 mmol/L (ref 135–145)
TCO2: 24 mmol/L (ref 0–100)

## 2015-11-04 LAB — TSH: TSH: 1.566 u[IU]/mL (ref 0.350–4.500)

## 2015-11-04 LAB — MAGNESIUM: Magnesium: 2.1 mg/dL (ref 1.7–2.4)

## 2015-11-04 LAB — PROTIME-INR
INR: 0.96 (ref 0.00–1.49)
INR: 5.1 (ref 0.00–1.49)
PROTHROMBIN TIME: 13 s (ref 11.6–15.2)
Prothrombin Time: 45.6 seconds — ABNORMAL HIGH (ref 11.6–15.2)

## 2015-11-04 LAB — POCT ACTIVATED CLOTTING TIME: ACTIVATED CLOTTING TIME: 435 s

## 2015-11-04 LAB — TROPONIN I
TROPONIN I: 1.01 ng/mL — AB (ref <0.031)
TROPONIN I: 15.22 ng/mL — AB (ref <0.031)
Troponin I: 0.84 ng/mL (ref <0.031)

## 2015-11-04 LAB — MRSA PCR SCREENING: MRSA by PCR: NEGATIVE

## 2015-11-04 LAB — APTT: aPTT: 26 seconds (ref 24–37)

## 2015-11-04 SURGERY — LEFT HEART CATH AND CORONARY ANGIOGRAPHY
Anesthesia: LOCAL

## 2015-11-04 MED ORDER — ATORVASTATIN CALCIUM 80 MG PO TABS: 80.0000 mg | ORAL_TABLET | Freq: Every day | ORAL | Status: DC

## 2015-11-04 MED ORDER — HEPARIN (PORCINE) IN NACL 100-0.45 UNIT/ML-% IJ SOLN
1100.0000 [IU]/h | INTRAMUSCULAR | Status: DC
  Filled 2015-11-04: qty 250

## 2015-11-04 MED ORDER — ASPIRIN 300 MG RE SUPP: 300.0000 mg | RECTAL | Status: DC

## 2015-11-04 MED ORDER — ACETAMINOPHEN 325 MG PO TABS
650.0000 mg | ORAL_TABLET | ORAL | Status: DC | PRN
  Filled 2015-11-04 (×12): qty 2

## 2015-11-04 MED ORDER — AMIODARONE HCL IN DEXTROSE 360-4.14 MG/200ML-% IV SOLN
30.0000 mg/h | INTRAVENOUS | Status: DC
  Administered 2015-11-04 – 2015-11-06 (×4): 30 mg/h via INTRAVENOUS
  Filled 2015-11-04 (×4): qty 200

## 2015-11-04 MED ORDER — SODIUM CHLORIDE 0.9 % WEIGHT BASED INFUSION
1.0000 mL/kg/h | INTRAVENOUS | Status: DC
  Administered 2015-11-04: 1 mL/kg/h via INTRAVENOUS

## 2015-11-04 MED ORDER — ONDANSETRON HCL 4 MG/2ML IJ SOLN: 4.0000 mg | Freq: Four times a day (QID) | INTRAMUSCULAR | Status: DC | PRN

## 2015-11-04 MED ORDER — SODIUM CHLORIDE 0.9 % IV SOLN
INTRAVENOUS | Status: DC
  Administered 2015-11-04: 17:00:00 via INTRAVENOUS

## 2015-11-04 MED ORDER — NITROGLYCERIN 0.4 MG SL SUBL
0.4000 mg | SUBLINGUAL_TABLET | SUBLINGUAL | Status: DC | PRN
  Administered 2015-11-04 – 2015-11-06 (×3): 0.4 mg via SUBLINGUAL
  Filled 2015-11-04 (×3): qty 1

## 2015-11-04 MED ORDER — HEPARIN SODIUM (PORCINE) 5000 UNIT/ML IJ SOLN
4000.0000 [IU] | INTRAMUSCULAR | Status: AC
  Administered 2015-11-04: 4000 [IU] via INTRAVENOUS

## 2015-11-04 MED ORDER — ASPIRIN 81 MG PO CHEW
CHEWABLE_TABLET | ORAL | Status: AC
Start: 2015-11-04 — End: 2015-11-04
  Filled 2015-11-04: qty 4

## 2015-11-04 MED ORDER — ACETAMINOPHEN 325 MG PO TABS
650.0000 mg | ORAL_TABLET | ORAL | Status: DC | PRN
  Administered 2015-11-05 – 2015-11-08 (×12): 650 mg via ORAL

## 2015-11-04 MED ORDER — ASPIRIN EC 81 MG PO TBEC
81.0000 mg | DELAYED_RELEASE_TABLET | Freq: Every day | ORAL | Status: DC
  Administered 2015-11-05 – 2015-11-08 (×4): 81 mg via ORAL
  Filled 2015-11-04 (×4): qty 1

## 2015-11-04 MED ORDER — MORPHINE SULFATE (PF) 2 MG/ML IV SOLN
2.0000 mg | INTRAVENOUS | Status: DC | PRN
Start: 2015-11-04 — End: 2015-11-08
  Administered 2015-11-04 – 2015-11-08 (×18): 2 mg via INTRAVENOUS
  Filled 2015-11-04 (×18): qty 1

## 2015-11-04 MED ORDER — BIVALIRUDIN 250 MG IV SOLR
INTRAVENOUS | Status: AC
  Filled 2015-11-04: qty 250

## 2015-11-04 MED ORDER — HEPARIN (PORCINE) IN NACL 2-0.9 UNIT/ML-% IJ SOLN
INTRAMUSCULAR | Status: AC
  Filled 2015-11-04: qty 1500

## 2015-11-04 MED ORDER — ASPIRIN 81 MG PO CHEW: 324.0000 mg | CHEWABLE_TABLET | ORAL | Status: DC

## 2015-11-04 MED ORDER — BIVALIRUDIN BOLUS VIA INFUSION - CUPID
INTRAVENOUS | Status: DC | PRN
Start: 2015-11-04 — End: 2015-11-04
  Administered 2015-11-04: 69.75 mg via INTRAVENOUS

## 2015-11-04 MED ORDER — SODIUM CHLORIDE 0.9 % IV SOLN
250.0000 mg | INTRAVENOUS | Status: DC | PRN
  Administered 2015-11-04 (×2): 1.75 mg/kg/h via INTRAVENOUS

## 2015-11-04 MED ORDER — METOPROLOL TARTRATE 1 MG/ML IV SOLN
INTRAVENOUS | Status: AC
  Filled 2015-11-04: qty 5

## 2015-11-04 MED ORDER — AMIODARONE HCL 150 MG/3ML IV SOLN
INTRAVENOUS | Status: DC | PRN
  Administered 2015-11-04: 150 mg via INTRAVENOUS

## 2015-11-04 MED ORDER — ASPIRIN 81 MG PO CHEW: 81.0000 mg | CHEWABLE_TABLET | Freq: Every day | ORAL | Status: DC

## 2015-11-04 MED ORDER — ATROPINE SULFATE 0.1 MG/ML IJ SOLN
INTRAMUSCULAR | Status: AC
  Filled 2015-11-04: qty 10

## 2015-11-04 MED ORDER — SODIUM CHLORIDE 0.9 % IV SOLN
1.7500 mg/kg/h | INTRAVENOUS | Status: AC
  Administered 2015-11-04: 1.75 mg/kg/h via INTRAVENOUS
  Filled 2015-11-04: qty 250

## 2015-11-04 MED ORDER — LIDOCAINE HCL (PF) 1 % IJ SOLN
INTRAMUSCULAR | Status: DC | PRN
  Administered 2015-11-04: 15 mL

## 2015-11-04 MED ORDER — METOPROLOL TARTRATE 12.5 MG HALF TABLET
12.5000 mg | ORAL_TABLET | Freq: Two times a day (BID) | ORAL | Status: DC
Start: 2015-11-04 — End: 2015-11-07
  Administered 2015-11-04 – 2015-11-06 (×5): 12.5 mg via ORAL
  Filled 2015-11-04 (×5): qty 1

## 2015-11-04 MED ORDER — HEPARIN SODIUM (PORCINE) 5000 UNIT/ML IJ SOLN: 60.0000 [IU]/kg | Freq: Once | INTRAMUSCULAR | Status: DC

## 2015-11-04 MED ORDER — SODIUM CHLORIDE 0.9 % IJ SOLN
3.0000 mL | Freq: Two times a day (BID) | INTRAMUSCULAR | Status: DC
  Administered 2015-11-04 – 2015-11-07 (×7): 3 mL via INTRAVENOUS

## 2015-11-04 MED ORDER — SODIUM CHLORIDE 0.45 % IV SOLN
INTRAVENOUS | Status: AC
  Administered 2015-11-04: 17:00:00 via INTRAVENOUS

## 2015-11-04 MED ORDER — ASPIRIN 81 MG PO CHEW
324.0000 mg | CHEWABLE_TABLET | Freq: Once | ORAL | Status: AC
  Administered 2015-11-04: 324 mg via ORAL

## 2015-11-04 MED ORDER — TICAGRELOR 90 MG PO TABS
90.0000 mg | ORAL_TABLET | Freq: Two times a day (BID) | ORAL | Status: DC
  Administered 2015-11-05 – 2015-11-06 (×4): 90 mg via ORAL
  Filled 2015-11-04 (×5): qty 1

## 2015-11-04 MED ORDER — TICAGRELOR 90 MG PO TABS
ORAL_TABLET | ORAL | Status: AC
  Filled 2015-11-04: qty 2

## 2015-11-04 MED ORDER — ATORVASTATIN CALCIUM 80 MG PO TABS
80.0000 mg | ORAL_TABLET | Freq: Every day | ORAL | Status: DC
  Administered 2015-11-04 – 2015-11-07 (×4): 80 mg via ORAL
  Filled 2015-11-04 (×4): qty 1

## 2015-11-04 MED ORDER — TICAGRELOR 90 MG PO TABS
ORAL_TABLET | ORAL | Status: DC | PRN
  Administered 2015-11-04: 180 mg via ORAL

## 2015-11-04 MED ORDER — AMIODARONE HCL 150 MG/3ML IV SOLN
INTRAVENOUS | Status: AC
  Filled 2015-11-04: qty 3

## 2015-11-04 MED ORDER — IOHEXOL 350 MG/ML SOLN
INTRAVENOUS | Status: DC | PRN
  Administered 2015-11-04: 130 mL via INTRAVENOUS

## 2015-11-04 MED ORDER — METOPROLOL TARTRATE 1 MG/ML IV SOLN
INTRAVENOUS | Status: DC | PRN
  Administered 2015-11-04: 5 mg via INTRAVENOUS

## 2015-11-04 MED ORDER — SODIUM CHLORIDE 0.9 % IJ SOLN: 3.0000 mL | INTRAMUSCULAR | Status: DC | PRN

## 2015-11-04 MED ORDER — ASPIRIN 81 MG PO CHEW: 324.0000 mg | CHEWABLE_TABLET | Freq: Once | ORAL | Status: DC

## 2015-11-04 MED ORDER — HEPARIN SODIUM (PORCINE) 5000 UNIT/ML IJ SOLN
INTRAMUSCULAR | Status: AC
Start: 2015-11-04 — End: 2015-11-04
  Filled 2015-11-04: qty 1

## 2015-11-04 MED ORDER — LIDOCAINE HCL (PF) 1 % IJ SOLN
INTRAMUSCULAR | Status: AC
  Filled 2015-11-04: qty 30

## 2015-11-04 MED ORDER — AMIODARONE HCL IN DEXTROSE 360-4.14 MG/200ML-% IV SOLN
60.0000 mg/h | INTRAVENOUS | Status: AC
  Administered 2015-11-04 (×2): 60 mg/h via INTRAVENOUS
  Filled 2015-11-04 (×2): qty 200

## 2015-11-04 MED ORDER — SODIUM CHLORIDE 0.9 % IV SOLN: 250.0000 mL | INTRAVENOUS | Status: DC | PRN

## 2015-11-04 MED ORDER — NITROGLYCERIN 1 MG/10 ML FOR IR/CATH LAB
INTRA_ARTERIAL | Status: AC
  Filled 2015-11-04: qty 10

## 2015-11-04 SURGICAL SUPPLY — 15 items
BALLN EUPHORA RX 2.0X12 (BALLOONS) ×2
BALLN ~~LOC~~ EMERGE MR 3.0X12 (BALLOONS) ×2
BALLOON EUPHORA RX 2.0X12 (BALLOONS) ×1 IMPLANT
BALLOON ~~LOC~~ EMERGE MR 3.0X12 (BALLOONS) ×1 IMPLANT
CATH INFINITI 5FR MULTPACK ANG (CATHETERS) ×2 IMPLANT
CATH VISTA GUIDE 6FR XBLAD3.5 (CATHETERS) ×2 IMPLANT
KIT ENCORE 26 ADVANTAGE (KITS) ×2 IMPLANT
KIT HEART LEFT (KITS) ×2 IMPLANT
PACK CARDIAC CATHETERIZATION (CUSTOM PROCEDURE TRAY) ×2 IMPLANT
SHEATH PINNACLE 6F 10CM (SHEATH) ×2 IMPLANT
STENT PROMUS PREM MR 2.75X16 (Permanent Stent) ×2 IMPLANT
TRANSDUCER W/STOPCOCK (MISCELLANEOUS) ×2 IMPLANT
TUBING CIL FLEX 10 FLL-RA (TUBING) ×2 IMPLANT
WIRE ASAHI PROWATER 180CM (WIRE) ×2 IMPLANT
WIRE EMERALD 3MM-J .035X150CM (WIRE) ×2 IMPLANT

## 2015-11-04 NOTE — Progress Notes (Signed)
ANTICOAGULATION CONSULT NOTE - Initial Consult  Pharmacy Consult for heparin Indication: STEMI w/ cont'd CP s/p cath  No Known Allergies  Patient Measurements: Height: 5\' 10"  (177.8 cm) Weight: 212 lb 8.4 oz (96.4 kg) IBW/kg (Calculated) : 73 Heparin Dosing Weight: 95kg  Vital Signs: Temp: 97.9 F (36.6 C) (12/21 1945) Temp Source: Oral (12/21 1945) BP: 119/77 mmHg (12/21 2300) Pulse Rate: 85 (12/21 2300)  Labs:  Recent Labs  11/04/15 1528 11/04/15 1535 11/04/15 1657 11/04/15 1711 11/04/15 2200  HGB 15.1 16.3  --  14.7  --   HCT 44.7 48.0  --  43.4  --   PLT 275  --   --  238  --   APTT 26  --   --  >200*  --   LABPROT 13.0  --   --  45.6*  --   INR 0.96  --   --  5.10*  --   CREATININE 1.29* 1.20  --  1.18  --   TROPONINI  --   --  0.84* 1.01* 15.22*    Estimated Creatinine Clearance: 73.7 mL/min (by C-G formula based on Cr of 1.18).   Medical History: Past Medical History  Diagnosis Date  . Kidney calculus   . Hyperlipidemia   . Colon polyp   . Smoker     Medications:  Prescriptions prior to admission  Medication Sig Dispense Refill Last Dose  . Aspirin-Acetaminophen-Caffeine (GOODY HEADACHE PO) Take 1 Package by mouth daily as needed (for pain).   11/04/2015 at Unknown time  . Cholecalciferol (VITAMIN D3) 1000 UNITS CAPS Take 1,000 Units by mouth daily as needed (takes occasionally).   Past Week at Unknown time   Scheduled:  . [START ON 11/05/2015] aspirin EC  81 mg Oral Daily  . atorvastatin  80 mg Oral q1800  . atropine      . metoprolol tartrate  12.5 mg Oral BID  . sodium chloride  3 mL Intravenous Q12H  . [START ON 11/05/2015] ticagrelor  90 mg Oral BID   Infusions:  . sodium chloride 75 mL/hr at 11/04/15 1713  . sodium chloride 10 mL/hr at 11/04/15 1653  . amiodarone 30 mg/hr (11/04/15 2241)    Assessment: 64yo male presented earlier today w/ STEMI, was sent emergently to cath lab where he rec'd DES, now cont's to c/o CP, to resume  heparin.  Goal of Therapy:  Heparin level 0.3-0.7 units/ml Monitor platelets by anticoagulation protocol: Yes   Plan:  Will start heparin gtt at 1300 units/hr and monitor heparin levels and CBC.  Vernard GamblesVeronda Edvin Albus, PharmD, BCPS  11/04/2015,11:56 PM

## 2015-11-04 NOTE — ED Notes (Signed)
Belongings sent to cath lab with patient. Will make wife aware on arrival.

## 2015-11-04 NOTE — Plan of Care (Addendum)
Patient continues to have chest pain and has elevated troponin >10 times baseline, Will treat it as as type IVa MI. Will restart on ACS protocol IV heparin. If chest pain does not improve, will consider GPIIb/IIIa inhibitor. Patient does not have any unstable rhythm, unstable hemodynamics and pain is <5/10. There was ulcerated 50% non revascularized plaque in RCA on cath report review.

## 2015-11-04 NOTE — Progress Notes (Signed)
Right Femoral Sheath, level 0 prior to sheath removal, distal pulses +1 Patient educated prior to removal pressure held for 20 minutes, started at 2253 and ended at 2313 Patient's vital signs stable throughout and patient had no complaints. post sheath removal groin site level 0 and distal pulses +1 post cath instructions given to patient, patient verbalizes understanding in his own words & demonstrated understanding. pressure dressing applied  Ivery QualeJackson, Ziomara Birenbaum A, RN

## 2015-11-04 NOTE — Progress Notes (Signed)
   11/04/15 1612  Clinical Encounter Type  Visited With Family  Visit Type Initial;Code  Referral From Nurse   Chaplain responded to a code STEMI in the ED. Chaplain helped patient's wife transition to the 2H waiting area, relayed her location to the Cath Lab team, and offered support. Chaplain support available as needed.   Alda Ponderdam M Kamauri Kathol, Chaplain 11/04/2015 4:13 PM

## 2015-11-04 NOTE — H&P (Signed)
Admit date: 11/04/2015 Referring Physician: Dr. Radford PaxBeaton Primary Cardiologist: Rica KoyanagiNon3 Chief complaint/reason for admission: Chest pain/STEMI  HPI: This is a 65yo WM with no prior cardiac history except dyslipidemia (stopped statin on his own) and ongoing tobacco use who has been having episodes of chest pain for the past week off and on.  He says that he would get pressure in his chest with sharp pains into his back that would last up to a hour at a time.  Today around an hour ago, while sitting at work, he developed severe substernal chest pressure with sharp pain into his back associated with severe diaphoresis and nausea but no vomiting.  He drove himself to the ER.  In the ER he was profoundly diaphoretic with 8/10 CP and ashen appearing.  EKG shows 4-705mm of ST elevation in V1-V3 and I and aVL with marked ST depression in the inferior leads.      PMH:    Past Medical History  Diagnosis Date  . Kidney calculus   . Hyperlipidemia   . Colon polyp   . Smoker     PSH:    Past Surgical History  Procedure Laterality Date  . Hernia repair      left inguinal  . Vasectomy      ALLERGIES:   Review of patient's allergies indicates no known allergies.  Prior to Admit Meds:   Prescriptions prior to admission  Medication Sig Dispense Refill Last Dose  . aspirin 325 MG tablet Take 325 mg by mouth daily.   Taking  . simvastatin (ZOCOR) 40 MG tablet Take 1 tablet (40 mg total) by mouth every evening. 90 tablet 1    Family HX:    Family History  Problem Relation Age of Onset  . Aneurysm Mother   . Dementia Father   . Dystonia Sister    Social HX:    Social History   Social History  . Marital Status: Married    Spouse Name: N/A  . Number of Children: N/A  . Years of Education: N/A   Occupational History  . Not on file.   Social History Main Topics  . Smoking status: Current Every Day Smoker  . Smokeless tobacco: Never Used  . Alcohol Use: Yes  . Drug Use: No  . Sexual Activity:  Not on file     Comment: married   Other Topics Concern  . Not on file   Social History Narrative     ROS:  All 11 ROS were addressed and are negative except what is stated in the HPI  PHYSICAL EXAM There were no vitals filed for this visit. General: Well developed, well nourished, in severe distress due to CP.  Ashen appearing and very diaphoretic.  Head: Eyes PERRLA, No xanthomas.   Normal cephalic and atramatic  Lungs:   Clear bilaterally to auscultation and percussion. Heart:   HRRR S1 S2 Pulses are 2+ & equal.            No carotid bruit. No JVD.  No abdominal bruits. No femoral bruits. Abdomen: Bowel sounds are positive, abdomen soft and non-tender without masses Extremities:   No clubbing, cyanosis or edema.  DP +1 Neuro: Alert and oriented X 3. Psych:  Good affect, responds appropriately   Labs:   Lab Results  Component Value Date   WBC 9.6 02/11/2013   HGB 14.8 02/11/2013   HCT 42.9 02/11/2013   MCV 90.3 02/11/2013   PLT 253 02/11/2013   No results for  input(s): NA, K, CL, CO2, BUN, CREATININE, CALCIUM, PROT, BILITOT, ALKPHOS, ALT, AST, GLUCOSE in the last 168 hours.  Invalid input(s): LABALBU No results found for: CKTOTAL, CKMB, CKMBINDEX, TROPONINI No results found for: PTT No results found for: INR, PROTIME   Lab Results  Component Value Date   CHOL 143 02/11/2013   Lab Results  Component Value Date   HDL 35* 02/11/2013   Lab Results  Component Value Date   LDLCALC 65 02/11/2013   Lab Results  Component Value Date   TRIG 217* 02/11/2013   Lab Results  Component Value Date   CHOLHDL 4.1 02/11/2013   No results found for: LDLDIRECT    Radiology:  No results found.  EKG:  NSR at 94bpm with 4-31mm of ST segment elevation in I, aVL, V1-V3 and marked ST segment depression in the inferior leads.  ASSESSMENT/PLAN: 1.  Acute anterior STEMI - pain started 1 hour ago with marked ST elevation in V1-V3 and I and aVL.  Will take emergently to cath lab.   Cardiac catheterization was discussed with the patient fully. The patient understands that risks include but are not limited to stroke (1 in 1000), death (1 in 1000), kidney failure [usually temporary] (1 in 500), bleeding (1 in 200), allergic reaction [possibly serious] (1 in 200).  The patient understands and is willing to proceed.  Start high dose statin, ASA, IV Heparin gtt.  No BB due to soft BP.  Check 2D echo to assess LVF.    2.  Nonsustained ventricular tachycardia on monitor on way to cath lab - no BB at present due to soft BP  3.  Dyslipidemia - history of this in the past but patient stopped his statin on his own.  Will check FLP and ALT and start high dose statin.   Quintella Reichert, MD  11/04/2015  3:36 PM

## 2015-11-04 NOTE — ED Provider Notes (Signed)
CSN: [id number]     Arrival date & time 11/04/15  1514 History   First MD Initiated Contact with Patient 11/04/15 1523     Chief Complaint  Patient presents with  . Chest Pain     (Consider location/radiation/quality/duration/timing/severity/associated sxs/prior Treatment) Patient is a 64 y.o. male presenting with chest pain. The history is provided by the patient.  Chest Pain Pain location:  Substernal area Pain quality: pressure   Pain radiates to:  Does not radiate Pain radiates to the back: no   Pain severity:  Severe Onset quality:  Gradual Duration:  1 hour Timing:  Constant Progression:  Worsening Chronicity:  New Context: at rest   Relieved by:  None tried Worsened by:  Nothing tried Ineffective treatments:  None tried Associated symptoms: diaphoresis, nausea and shortness of breath   Associated symptoms: no abdominal pain, no AICD problem, no altered mental status, no anorexia, no anxiety, no back pain, no claudication, no cough, no dizziness, no dysphagia, no fatigue, no fever, no headache, no heartburn, no lower extremity edema, no near-syncope, no numbness, no orthopnea, no palpitations, no syncope, not vomiting and no weakness   Risk factors: male sex, obesity and smoking   Risk factors: no coronary artery disease, no high cholesterol and no hypertension     Past Medical History  Diagnosis Date  . Kidney calculus   . Hyperlipidemia   . Colon polyp   . Smoker    Past Surgical History  Procedure Laterality Date  . Hernia repair      left inguinal  . Vasectomy     Family History  Problem Relation Age of Onset  . Aneurysm Mother   . Dementia Father   . Dystonia Sister    Social History  Substance Use Topics  . Smoking status: Current Every Day Smoker  . Smokeless tobacco: Never Used  . Alcohol Use: Yes    Review of Systems  Constitutional: Positive for diaphoresis. Negative for fever and fatigue.  HENT: Negative for trouble swallowing.    Respiratory: Positive for chest tightness and shortness of breath. Negative for cough.   Cardiovascular: Positive for chest pain. Negative for palpitations, orthopnea, claudication, leg swelling, syncope and near-syncope.  Gastrointestinal: Positive for nausea. Negative for heartburn, vomiting, abdominal pain, blood in stool and anorexia.  Musculoskeletal: Negative for back pain.  Neurological: Negative for dizziness, weakness, numbness and headaches.      Allergies  Review of patient's allergies indicates no known allergies.  Home Medications   Prior to Admission medications   Medication Sig Start Date End Date Taking? Authorizing Provider  aspirin 325 MG tablet Take 325 mg by mouth daily.    Historical Provider, MD  simvastatin (ZOCOR) 40 MG tablet Take 1 tablet (40 mg total) by mouth every evening. 08/07/13   Donita Brooks, MD   Wt 92.987 kg  SpO2 98% Physical Exam  Constitutional: He is oriented to person, place, and time. He appears well-developed and well-nourished. No distress.  HENT:  Head: Normocephalic and atraumatic.  Eyes: Conjunctivae and EOM are normal. Pupils are equal, round, and reactive to light. Right eye exhibits no discharge. Left eye exhibits no discharge.  Neck: Normal range of motion. Neck supple.  Cardiovascular: Normal rate and regular rhythm.  Exam reveals no gallop and no friction rub.   No murmur heard. Pulmonary/Chest: Effort normal and breath sounds normal. No respiratory distress. He has no wheezes. He has no rales. He exhibits no tenderness, no edema, no deformity and no  retraction.  Abdominal: Soft. Bowel sounds are normal. He exhibits no distension and no mass. There is no tenderness. There is no rebound and no guarding.  Neurological: He is alert and oriented to person, place, and time.  Skin: No rash noted. He is diaphoretic.  Psychiatric: His speech is normal and behavior is normal.    ED Course  Procedures (including critical care  time) Labs Review Labs Reviewed  POCT I-STAT, CHEM 8 - Abnormal; Notable for the following:    Glucose, Bld 110 (*)    All other components within normal limits  POCT I-STAT TROPONIN I - Abnormal; Notable for the following:    Troponin i, poc 0.11 (*)    All other components within normal limits  APTT  CBC  COMPREHENSIVE METABOLIC PANEL  PROTIME-INR  I-STAT TROPOININ, ED    Imaging Review No results found. I have personally reviewed and evaluated these images and lab results as part of my medical decision-making.   EKG Interpretation   Date/Time:  Wednesday November 04 2015 15:29:26 EST Ventricular Rate:  94 PR Interval:    QRS Duration: 106 QT Interval:  345 QTC Calculation: 431 R Axis:   -16 Text Interpretation:  Accelerated junctional rhythm Left ventricular  hypertrophy Anterolateral infarct, acute (LAD) Baseline wander in lead(s)  II III aVF V2 V3 V5 V6 ** ** ACUTE MI / STEMI ** ** Confirmed by BEATON   MD, ROBERT ([id number]) on 11/04/2015 3:43:04 PM      MDM   Final diagnoses:  ST elevation myocardial infarction (STEMI), unspecified artery (HCC)     64 year old Caucasian gentleman with past medical history tobacco abuse presents in the setting of chest pain. Patient reports approximately 1 hour prior to arrival he started having retrosternal chest pressure, diaphoresis, shortness of breath. He did report intermittent pain over the last several days. On arrival EKG was obtained which demonstrated STEMI. Distribution consistent with likely left main artery occlusion. ST segment elevation noted in anterior leads with reciprocal changes. In setting of this finding code STEMI was initiated. Patient received 324 mg of aspirin and heparin bolus. Lab analysis was drawn and cardiology was consulted. Cardiology quickly arrived at bedside and concurred with EKG findings. IV access was obtained and patient taken to cath lab for cardiac catheterization. Patient stable at time of  transfer of care.  Attending has seen and evaluated patient and Dr. Radford PaxBeaton is in agreement with plan.   Stacy GardnerAndrew Dannie Woolen, MD 11/04/15 [phone]  Nelva Nayobert Beaton, MD 11/04/15 [phone]

## 2015-11-04 NOTE — ED Notes (Signed)
Pt completely undressed. Belongings placed in bag. Pt placed on radio translucent zoll pads, 12 lead, pulse ox, and BP cuff. EDP, 2 RNs, and Dr. Mayford Knifeurner at bedside. 2 PIVs placed (see documentation).

## 2015-11-04 NOTE — ED Notes (Signed)
Dr Mayford Knifeturner atr the bedisde.  The pt was given 324mg  of aspirin and heparin 4,000 units just now   Unable to scan the pt at present too many people at bedside

## 2015-11-04 NOTE — Progress Notes (Signed)
CRITICAL VALUE ALERT  Critical value received:  Troponin: 1.01; INR: 5.10; PTT >200  Date of notification:  1833  Time of notification:  1838  Critical value read back:Yes.    Nurse who received alert:  Cay SchillingsShelby Alecxander Mainwaring, RN  MD notified (1st page):  B. Sharol HarnessSimmons, [place]  Time of first page:  1838  Responding MD:  B. Sharol HarnessSimmons, [place]  Time MD responded:  1901  MD text paged. Expected lab values, patient post-cath and PCI to LAD. Will continue to monitor.

## 2015-11-04 NOTE — ED Notes (Signed)
Pt drove himself here  With chest pain for one hour.  No previous history.  Sl sob  Very diaphorectic.  He has had chest pain for 3-4 days previously.  Alert oriented

## 2015-11-04 NOTE — Progress Notes (Signed)
Paged MD Virgina OrganQureshi to inform of post cardiac catheterization Troponin 15.22. Patient still complaining of chest pain.  Patient is ambiguous and indecisive when rating pain using the 1-10 pain scale.  Patient states that the morphine "takes the edge off" of the chest pain and sublingual nitroglycerin did not help per the patient.  New orders received. Roger QualeJackson, Fed Ceci A, RN

## 2015-11-05 ENCOUNTER — Inpatient Hospital Stay (HOSPITAL_COMMUNITY): Payer: BLUE CROSS/BLUE SHIELD

## 2015-11-05 ENCOUNTER — Encounter (HOSPITAL_COMMUNITY): Payer: Self-pay | Admitting: Cardiovascular Disease

## 2015-11-05 DIAGNOSIS — I2510 Atherosclerotic heart disease of native coronary artery without angina pectoris: Secondary | ICD-10-CM

## 2015-11-05 DIAGNOSIS — R0602 Shortness of breath: Secondary | ICD-10-CM | POA: Insufficient documentation

## 2015-11-05 LAB — POCT I-STAT 3, ART BLOOD GAS (G3+)
ACID-BASE DEFICIT: 2 mmol/L (ref 0.0–2.0)
BICARBONATE: 20.4 meq/L (ref 20.0–24.0)
O2 Saturation: 91 %
TCO2: 21 mmol/L (ref 0–100)
pCO2 arterial: 27.2 mmHg — ABNORMAL LOW (ref 35.0–45.0)
pH, Arterial: 7.483 — ABNORMAL HIGH (ref 7.350–7.450)
pO2, Arterial: 55 mmHg — ABNORMAL LOW (ref 80.0–100.0)

## 2015-11-05 LAB — CBC
HCT: 44.5 % (ref 39.0–52.0)
HEMATOCRIT: 43.4 % (ref 39.0–52.0)
Hemoglobin: 14.9 g/dL (ref 13.0–17.0)
Hemoglobin: 14.8 g/dL (ref 13.0–17.0)
MCH: 30.9 pg (ref 26.0–34.0)
MCH: 31.4 pg (ref 26.0–34.0)
MCHC: 33.5 g/dL (ref 30.0–36.0)
MCHC: 34.1 g/dL (ref 30.0–36.0)
MCV: 92.3 fL (ref 78.0–100.0)
MCV: 92.1 fL (ref 78.0–100.0)
PLATELETS: 218 10*3/uL (ref 150–400)
PLATELETS: 237 10*3/uL (ref 150–400)
RBC: 4.82 MIL/uL (ref 4.22–5.81)
RBC: 4.71 MIL/uL (ref 4.22–5.81)
RDW: 13.1 % (ref 11.5–15.5)
RDW: 13.2 % (ref 11.5–15.5)
WBC: 13.9 10*3/uL — AB (ref 4.0–10.5)
WBC: 15.5 10*3/uL — AB (ref 4.0–10.5)

## 2015-11-05 LAB — TROPONIN I
TROPONIN I: 11.46 ng/mL — AB (ref <0.031)
Troponin I: 10.3 ng/mL (ref <0.031)

## 2015-11-05 LAB — HEPARIN LEVEL (UNFRACTIONATED)
HEPARIN UNFRACTIONATED: 0.38 [IU]/mL (ref 0.30–0.70)
Heparin Unfractionated: 0.29 IU/mL — ABNORMAL LOW (ref 0.30–0.70)
Heparin Unfractionated: 0.47 IU/mL (ref 0.30–0.70)

## 2015-11-05 LAB — LIPID PANEL
CHOL/HDL RATIO: 5.4 ratio
Cholesterol: 239 mg/dL — ABNORMAL HIGH (ref 0–200)
HDL: 44 mg/dL (ref >40)
LDL Cholesterol: 172 mg/dL — ABNORMAL HIGH (ref 0–99)
Triglycerides: 113 mg/dL (ref <150)
VLDL: 23 mg/dL (ref 0–40)

## 2015-11-05 LAB — BASIC METABOLIC PANEL
ANION GAP: 11 (ref 5–15)
BUN: 14 mg/dL (ref 6–20)
CHLORIDE: 106 mmol/L (ref 101–111)
CO2: 20 mmol/L — ABNORMAL LOW (ref 22–32)
Calcium: 8.6 mg/dL — ABNORMAL LOW (ref 8.9–10.3)
Creatinine, Ser: 1.09 mg/dL (ref 0.61–1.24)
GFR calc Af Amer: >60 mL/min (ref >60)
GLUCOSE: 155 mg/dL — AB (ref 65–99)
POTASSIUM: 3.8 mmol/L (ref 3.5–5.1)
Sodium: 137 mmol/L (ref 135–145)

## 2015-11-05 LAB — PROTIME-INR
INR: 1.06 (ref 0.00–1.49)
PROTHROMBIN TIME: 14 s (ref 11.6–15.2)

## 2015-11-05 LAB — HEMOGLOBIN A1C
Hgb A1c MFr Bld: 6.5 % — ABNORMAL HIGH (ref 4.8–5.6)
MEAN PLASMA GLUCOSE: 140 mg/dL

## 2015-11-05 LAB — BRAIN NATRIURETIC PEPTIDE: B Natriuretic Peptide: 208.7 pg/mL — ABNORMAL HIGH (ref 0.0–100.0)

## 2015-11-05 MED ORDER — FUROSEMIDE 10 MG/ML IJ SOLN
40.0000 mg | Freq: Once | INTRAMUSCULAR | Status: AC
  Administered 2015-11-05: 40 mg via INTRAVENOUS
  Filled 2015-11-05: qty 4

## 2015-11-05 MED ORDER — HEPARIN (PORCINE) IN NACL 100-0.45 UNIT/ML-% IJ SOLN
1450.0000 [IU]/h | INTRAMUSCULAR | Status: DC
  Administered 2015-11-05: 1300 [IU]/h via INTRAVENOUS
  Filled 2015-11-05 (×2): qty 250

## 2015-11-05 MED ORDER — IPRATROPIUM-ALBUTEROL 0.5-2.5 (3) MG/3ML IN SOLN
3.0000 mL | Freq: Four times a day (QID) | RESPIRATORY_TRACT | Status: DC | PRN
  Administered 2015-11-05 – 2015-11-08 (×5): 3 mL via RESPIRATORY_TRACT
  Filled 2015-11-05 (×5): qty 3

## 2015-11-05 MED ORDER — GUAIFENESIN-DM 100-10 MG/5ML PO SYRP
10.0000 mL | ORAL_SOLUTION | ORAL | Status: DC | PRN
  Administered 2015-11-05 – 2015-11-08 (×2): 10 mL via ORAL
  Filled 2015-11-05 (×2): qty 10

## 2015-11-05 MED FILL — Nitroglycerin IV Soln 100 MCG/ML in D5W: INTRA_ARTERIAL | Qty: 10 | Status: AC

## 2015-11-05 MED FILL — Heparin Sodium (Porcine) 2 Unit/ML in Sodium Chloride 0.9%: INTRAMUSCULAR | Qty: 1500 | Status: AC

## 2015-11-05 NOTE — Progress Notes (Addendum)
CARDIAC REHAB PHASE I   PRE:  Rate/Rhythm: 83 SR  BP:  Sitting: 104/61        SaO2: 98 RA  MODE:  Ambulation: 350 ft   POST:  Rate/Rhythm: 100 SR  BP:  Sitting: 121/77         SaO2: 98 RA  Pt lying in bed c/o 5/10 chest aching, states ongoing since last night. RN aware. Pt ambulated 350 ft on RA, handheld assist, IV, steady gait, tolerated well. Pt c/o of mild DOE, denies dizziness, declined rest stop. Chest discomfort unchanged with ambulation. VSS. Began MI/stent education with pt and wife at bedside.  Reviewed risk factors, anti-platelet therapy, stent card, activity restrictions, tobacco cessation and phase 2 cardiac rehab. Left diet handouts, will plan to finish education tomorrow. Encouraged ambulation as tolerated. Pt verbalized understanding, receptive to education. Pt agrees to phase 2 cardiac rehab referral, [facility] Pt to bed after walk per pt request, call bell within reach. Will follow.  [id number] Joylene GrapesMonge, Khyan Oats C, RN, BSN 11/05/2015 12:20 PM

## 2015-11-05 NOTE — Progress Notes (Signed)
0230 - patient complained of an increased cough and some shortness of breath.  Patient felt that due to the increased coughing there had been no improvement in chest pain.  Patient stated he had a cold prior to hospital arrival that he had been taking over the counter cold medicines for.  Ordered patient Robitussin & administered same.  [id number] - Patient still coughing, increased work of breathing, and diaphoretic.  Patient stated he feels his shortness of breath has increased.  EKG obtained, oxygen saturation 95% on room air, blood pressure 117/71 (84), heart rate in the 80's.  Placed patient on 2L oxygen and paged MD Virgina OrganQureshi to inform.  MD ordered nebulizer, ABG, and chest xray.  MD advised if needed patient may need to be placed on Bi-Pap.  Called respiratory therapy to request nebulizer and ABG.  Ivery QualeJackson, Hang Ammon A, RN

## 2015-11-05 NOTE — Progress Notes (Addendum)
ANTICOAGULATION CONSULT NOTE - Follow Up Consult  Pharmacy Consult for Heparin Indication: s/p STEMI, afib  No Known Allergies  Patient Measurements: Height: 5\' 10"  (177.8 cm) Weight: 212 lb 8.4 oz (96.4 kg) IBW/kg (Calculated) : 73 Heparin Dosing Weight:  95 kg  Vital Signs: Temp: 97.8 F (36.6 C) (12/22 1227) Temp Source: Oral (12/22 1227) BP: 114/77 mmHg (12/22 1400) Pulse Rate: 82 (12/22 1400)  Labs:  Recent Labs  11/04/15 1528 11/04/15 1535  11/04/15 1711 11/04/15 2200 11/05/15 0502 11/05/15 0713 11/05/15 1510  HGB 15.1 16.3  --  14.7  --  14.9 14.8  --   HCT 44.7 48.0  --  43.4  --  44.5 43.4  --   PLT 275  --   --  238  --  218 237  --   APTT 26  --   --  >200*  --   --   --   --   LABPROT 13.0  --   --  45.6*  --  14.0  --   --   INR 0.96  --   --  5.10*  --  1.06  --   --   HEPARINUNFRC  --   --   --   --   --   --  0.29* 0.38  CREATININE 1.29* 1.20  --  1.18  --  1.09  --   --   TROPONINI  --   --   < > 1.01* 15.22* 10.30*  11.46*  --   --   < > = values in this interval not displayed.  Estimated Creatinine Clearance: 79.8 mL/min (by C-G formula based on Cr of 1.09).   Assessment: 64yo male presented 12/22 w/ STEMI, was sent emergently to cath lab where he rec'd DES, now cont's to c/o CP, heparin continuing.  Heparin for ongoing CP post-cath. (Elevated initial coags resulting from Angiomax received in cath lab). HL therapeutic x1 (0.38) on 1450 units/h. F/u 6h HL to confirm. CBC stable, no bleed documented.   Goal of Therapy:  Heparin level 0.3-0.7 units/ml Monitor platelets by anticoagulation protocol: Yes   Plan:  Heparin at 1450 units/hr 6h HL to confirm Daily HL and CBC Mon s/sx bleeding  Babs BertinHaley Annell Canty, PharmD, BCPS Clinical Pharmacist Pager [phone] 11/05/2015 4:12 PM  ADDENDUM:  HL now therapeutic x2 (0.47) on 1450 units/h. To follow daily HLs. No bleed documented.   Plan:  Heparin at 1450 units/h Daily HL and CBC Mon s/sx  bleeding  Babs BertinHaley Joanthan Hlavacek, PharmD, [facility] LP[facility] Clinical Pharmacist Pager [phone] 11/05/2015 10:36 PM

## 2015-11-05 NOTE — Progress Notes (Signed)
Utilization review completed.  

## 2015-11-05 NOTE — Progress Notes (Signed)
Patient Name: Roger PRABHAKAR Date of Encounter: 11/05/2015   SUBJECTIVE  Given breathing treatment last night for sob and wheezing. Currently no sob. Continued to have mid sternal cp. Ambulated well. He is recovering for cold/URI for the past two weeks.   CURRENT MEDS . aspirin EC  81 mg Oral Daily  . atorvastatin  80 mg Oral q1800  . metoprolol tartrate  12.5 mg Oral BID  . sodium chloride  3 mL Intravenous Q12H  . ticagrelor  90 mg Oral BID    OBJECTIVE  Filed Vitals:   11/05/15 0600 11/05/15 0700 11/05/15 0800 11/05/15 0900  BP: 107/71 115/75 117/75 105/75  Pulse: 87 88 88 93  Temp:   98.2 F (36.8 C)   TempSrc:   Oral   Resp: Height:      Weight:      SpO2: 97% 95% 97% 99%    Intake/Output Summary (Last 24 hours) at 11/05/15 1145 Last data filed at 11/05/15 0900  Gross per 24 hour  Intake 1422.17 ml  Output   1765 ml  Net -342.83 ml   Filed Weights   11/04/15 1526 11/04/15 1645  Weight: 205 lb (92.987 kg) 212 lb 8.4 oz (96.4 kg)    PHYSICAL EXAM  General: Pleasant, NAD. Neuro: Alert and oriented X 3. Moves all extremities spontaneously. Psych: Normal affect. HEENT:  Normal  Neck: Supple without bruits or JVD. Lungs:  Resp regular and unlabored, CTA. Heart: RRR no s3, s4, or murmurs. Abdomen: Soft, non-tender, non-distended, BS + x 4.  Extremities: No clubbing, cyanosis or edema. DP/PT/Radials 2+ and equal bilaterally. R groin without hematoma or bruit.   Accessory Clinical Findings  CBC  Recent Labs  11/04/15 1711 11/05/15 0502 11/05/15 0713  WBC 13.6* 13.9* 15.5*  NEUTROABS 9.7*  --   --   HGB 14.7 14.9 14.8  HCT 43.4 44.5 43.4  MCV 92.1 92.3 92.1  PLT 238 218 237   Basic Metabolic Panel  Recent Labs  11/04/15 1711 11/05/15 0502  NA 140 137  K 4.9 3.8  CL 108 106  CO2 22 20*  GLUCOSE 119* 155*  BUN 14 14  CREATININE 1.18 1.09  CALCIUM 8.7* 8.6*  MG 2.1  --    Liver Function Tests  Recent Labs   11/04/15 1528 11/04/15 1711  AST 23 29  ALT 27 25  ALKPHOS 106 100  BILITOT 0.4 0.8  PROT 6.2* 5.7*  ALBUMIN 3.9 3.5   No results for input(s): LIPASE, AMYLASE in the last 72 hours. Cardiac Enzymes  Recent Labs  11/04/15 1711 11/04/15 2200 11/05/15 0502  TROPONINI 1.01* 15.22* 10.30*  11.46*   Hemoglobin A1C  Recent Labs  11/04/15 1711  HGBA1C 6.5*   Fasting Lipid Panel  Recent Labs  11/05/15 0502  CHOL 239*  HDL 44  LDLCALC 172*  TRIG 113  CHOLHDL 5.4   Thyroid Function Tests  Recent Labs  11/04/15 1711  TSH 1.566    TELE  Sinus rhythm. Few small beats of NSVT  Cath 11/04/2015 Procedures    Coronary Stent Intervention   Left Heart Cath and Coronary Angiography    Conclusion     Mid RCA lesion, 50% stenosed.  Mid RCA to Dist RCA lesion, 40% stenosed.  Mid LAD lesion, 100% stenosed. Post intervention, there is a 0% residual stenosis.  Prox LAD to Mid LAD lesion, 50% stenosed.  Roger [name] is a 64 y.o. male   IMPRESSION:Mr.  Roger Contesubanks had a large anterior ST segment elevation myocardial infarction with an occluded proximal LAD. He did have a 50- 60% hypodense ulcer ulcerated mid dominant RCA stenosis that did not appear to be flow-limiting. He had successful reperfusion with direct angioplasty, and a door to balloon time of 20 minutes. He was treated with Angiomax and Plavix therapy including Brilenta. He fibrillated at after stent deployment requiring defibrillation twice. CPR was not performed. The patient was alert and awake at the end of the case. The sheath was secured. The patient left the lab in stable condition hemodynamically and electrically on Angiomax and amiodarone IV. He will be treated with standard post-MI medications including high-dose statin drug, ACE inhibitor, beta blocker and antiplatelet therapy. A 2-D echo will be obtained tomorrow for LV function. At this point, his moderate RCA disease and be treated medically. He  will need an outpatient Myoview once he recovers from his myocardial infarction.  Radiology/Studies  Dg Chest Port 1 View  11/05/2015  CLINICAL DATA:  Increasing shortness of breath EXAM: PORTABLE CHEST 1 VIEW COMPARISON:  None. FINDINGS: No cardiomegaly for technique.  Negative aortic and hilar contours. Interstitial prominence with probable Kerley lines. No focal consolidation. No pneumothorax. Lateral costophrenic sulci are incompletely visualized ; no detected pleural fluid. IMPRESSION: Interstitial coarsening- suspect developing pulmonary edema. Electronically Signed   By: Marnee SpringJonathon  Watts M.D.   On: 11/05/2015 04:16    ASSESSMENT AND PLAN    1. Acute ST elevation myocardial infarction (STEMI) of anterior wall (HCC) - emergent cath showed 100% mid stenosed LAD s/p PTCA and DES and 50- 60% hypodense ulcer ulcerated mid dominant RCA stenosis that did not appear to be flow-limiting. - continued to have chest pain. Continue IV heparin, ASA , statin, BB and brilinta. He is also recovering from URI/cold - suspect acute pericarditis (viral vs bacterial) Consider adding colchicine and NSAID, If added need to stop ASA and start protonix to reduce bleeding risk. MD to decide.  - pending echo. TSH normal.   2. Nonsustained ventricular tachycardia  - on monitor on way to cath lab. She also showed few small beats of NSVT - Continue metoprolol 12.5mg  BID. BP soft low.   3. Dyslipidemia - 11/05/2015: Cholesterol 239*; HDL 44; LDL Cholesterol 172*; Triglycerides 113; VLDL 23  - Continue stain  - Consider OP f/u labs 6-8 weeks given statin initiation this admission.  4. Leukocytosis - WBC trending up. No fever.Follow closely. CXR with interstitial coarsening with probable kerley lines.   5. HgbA1c of 6.5 with elevated hyperglycemia - F/u with PCP for lifestyle modification vs medical management  6. Tobacco abuse - Advice smoking cessation. Education given    Programmer, systemsigned, Teddie Curd  PA-C Pager [phone]

## 2015-11-05 NOTE — Progress Notes (Signed)
ANTICOAGULATION CONSULT NOTE - Follow Up Consult  Pharmacy Consult for Heparin Indication: s/p STEMI, afib  No Known Allergies  Patient Measurements: Height: 5\' 10"  (177.8 cm) Weight: 212 lb 8.4 oz (96.4 kg) IBW/kg (Calculated) : 73 Heparin Dosing Weight:  95 kg  Vital Signs: Temp: 97.8 F (36.6 C) (12/22 0400) Temp Source: Oral (12/22 0400) BP: 115/75 mmHg (12/22 0700) Pulse Rate: 88 (12/22 0700)  Labs:  Recent Labs  11/04/15 1528 11/04/15 1535  11/04/15 1711 11/04/15 2200 11/05/15 0502 11/05/15 0713  HGB 15.1 16.3  --  14.7  --  14.9 14.8  HCT 44.7 48.0  --  43.4  --  44.5 43.4  PLT 275  --   --  238  --  218 237  APTT 26  --   --  >200*  --   --   --   LABPROT 13.0  --   --  45.6*  --  14.0  --   INR 0.96  --   --  5.10*  --  1.06  --   HEPARINUNFRC  --   --   --   --   --   --  0.29*  CREATININE 1.29* 1.20  --  1.18  --  1.09  --   TROPONINI  --   --   < > 1.01* 15.22* 10.30*  11.46*  --   < > = values in this interval not displayed.  Estimated Creatinine Clearance: 79.8 mL/min (by C-G formula based on Cr of 1.09).   Assessment: 64yo male presented 12/22 w/ STEMI, was sent emergently to cath lab where he rec'd DES, now cont's to c/o CP, to resume heparin.  Anticoag: Heparin for ongoing CP post-cath. (Elevated initial coags resulting from Angiomax received in cath lab). Heparin level 0.29. H/H stable.  Goal of Therapy:  Heparin level 0.3-0.7 units/ml Monitor platelets by anticoagulation protocol: Yes   Plan:  Increase heparin to 1450 units/hr Recheck heparin level in 6 hrs. Daily HL and CBC   Devereaux Grayson S. Merilynn Finlandobertson, PharmD, BCPS Clinical Staff Pharmacist Pager [phone]  Misty Stanleyobertson, Jayel Inks Stillinger 11/05/2015,8:34 AM

## 2015-11-05 NOTE — Progress Notes (Signed)
  Echocardiogram 2D Echocardiogram has been performed.  Janalyn HarderWest, Fortune Brannigan R 11/05/2015, 8:59 AM

## 2015-11-05 NOTE — Progress Notes (Signed)
MD Virgina OrganQureshi to bedside to see patient.  MD requested a second duoneb be given now due to patient is still wheezing.  Additional orders received. Ivery QualeJackson, Christain Mcraney A, RN

## 2015-11-06 ENCOUNTER — Inpatient Hospital Stay (HOSPITAL_COMMUNITY): Payer: BLUE CROSS/BLUE SHIELD

## 2015-11-06 LAB — CBC
HCT: 43.4 % (ref 39.0–52.0)
Hemoglobin: 14.9 g/dL (ref 13.0–17.0)
MCH: 31.4 pg (ref 26.0–34.0)
MCHC: 34.3 g/dL (ref 30.0–36.0)
MCV: 91.6 fL (ref 78.0–100.0)
PLATELETS: 241 10*3/uL (ref 150–400)
RBC: 4.74 MIL/uL (ref 4.22–5.81)
RDW: 13.4 % (ref 11.5–15.5)
WBC: 13.8 10*3/uL — AB (ref 4.0–10.5)

## 2015-11-06 LAB — COMPREHENSIVE METABOLIC PANEL
ALT: 37 U/L (ref 17–63)
AST: 69 U/L — ABNORMAL HIGH (ref 15–41)
Albumin: 3.5 g/dL (ref 3.5–5.0)
Alkaline Phosphatase: 81 U/L (ref 38–126)
Anion gap: 12 (ref 5–15)
BUN: 13 mg/dL (ref 6–20)
CHLORIDE: 104 mmol/L (ref 101–111)
CO2: 22 mmol/L (ref 22–32)
CREATININE: 1.11 mg/dL (ref 0.61–1.24)
Calcium: 8.7 mg/dL — ABNORMAL LOW (ref 8.9–10.3)
Glucose, Bld: 119 mg/dL — ABNORMAL HIGH (ref 65–99)
POTASSIUM: 3.9 mmol/L (ref 3.5–5.1)
SODIUM: 138 mmol/L (ref 135–145)
Total Bilirubin: 0.6 mg/dL (ref 0.3–1.2)
Total Protein: 6.2 g/dL — ABNORMAL LOW (ref 6.5–8.1)

## 2015-11-06 LAB — MAGNESIUM: MAGNESIUM: 1.9 mg/dL (ref 1.7–2.4)

## 2015-11-06 LAB — HEPARIN LEVEL (UNFRACTIONATED): Heparin Unfractionated: 0.46 IU/mL (ref 0.30–0.70)

## 2015-11-06 MED ORDER — AZITHROMYCIN 500 MG PO TABS
500.0000 mg | ORAL_TABLET | Freq: Every day | ORAL | Status: AC
  Administered 2015-11-06: 500 mg via ORAL
  Filled 2015-11-06: qty 1

## 2015-11-06 MED ORDER — AZITHROMYCIN 250 MG PO TABS
250.0000 mg | ORAL_TABLET | Freq: Every day | ORAL | Status: DC
  Administered 2015-11-07 – 2015-11-08 (×2): 250 mg via ORAL
  Filled 2015-11-06 (×4): qty 1

## 2015-11-06 MED ORDER — FUROSEMIDE 20 MG PO TABS
20.0000 mg | ORAL_TABLET | Freq: Every day | ORAL | Status: DC
  Administered 2015-11-06 – 2015-11-08 (×3): 20 mg via ORAL
  Filled 2015-11-06 (×3): qty 1

## 2015-11-06 MED ORDER — POTASSIUM CHLORIDE CRYS ER 10 MEQ PO TBCR
10.0000 meq | EXTENDED_RELEASE_TABLET | Freq: Every day | ORAL | Status: DC
  Administered 2015-11-06 – 2015-11-08 (×3): 10 meq via ORAL
  Filled 2015-11-06 (×3): qty 1

## 2015-11-06 MED ORDER — FUROSEMIDE 10 MG/ML IJ SOLN
40.0000 mg | Freq: Once | INTRAMUSCULAR | Status: AC
  Administered 2015-11-06: 40 mg via INTRAVENOUS
  Filled 2015-11-06: qty 4

## 2015-11-06 MED ORDER — ENOXAPARIN SODIUM 40 MG/0.4ML ~~LOC~~ SOLN
40.0000 mg | SUBCUTANEOUS | Status: DC
  Administered 2015-11-06 – 2015-11-07 (×2): 40 mg via SUBCUTANEOUS
  Filled 2015-11-06 (×2): qty 0.4

## 2015-11-06 MED ORDER — POTASSIUM CHLORIDE CRYS ER 20 MEQ PO TBCR
20.0000 meq | EXTENDED_RELEASE_TABLET | Freq: Once | ORAL | Status: AC
  Administered 2015-11-06: 20 meq via ORAL
  Filled 2015-11-06: qty 1

## 2015-11-06 NOTE — Progress Notes (Signed)
Pt cojmplained of chest pain , placed on oxygen 2L and one nitro sl was given . Will continue to monitor. Tarri GlennFolashade Alabi, RN

## 2015-11-06 NOTE — Progress Notes (Signed)
SUBJECTIVE:  Denies any further CP.  Did have orthopnea last PM  OBJECTIVE:   Vitals:   Filed Vitals:   11/06/15 0607 11/06/15 0751 11/06/15 0800 11/06/15 0900  BP: 109/66  116/79 110/56  Pulse: 94  91 97  Temp:  97.9 F (36.6 C)    TempSrc:  Oral    Resp: 22  24 22   Height:      Weight:      SpO2: 99%  96% 98%   I&O's:   Intake/Output Summary (Last 24 hours) at 11/06/15 1011 Last data filed at 11/06/15 [id number]  Gross per 24 hour  Intake 1087.6 ml  Output   2585 ml  Net -1497.4 ml   TELEMETRY: Reviewed telemetry pt in NSR:     PHYSICAL EXAM General: Well developed, well nourished, in no acute distress Head: Eyes PERRLA, No xanthomas.   Normal cephalic and atramatic  Lungs:   Clear bilaterally to auscultation and percussion. Heart:   HRRR S1 S2 Pulses are 2+ & equal. Abdomen: Bowel sounds are positive, abdomen soft and non-tender without masses  Extremities:   No clubbing, cyanosis or edema.  DP +1 Neuro: Alert and oriented X 3. Psych:  Good affect, responds appropriately   LABS: Basic Metabolic Panel:  Recent Labs  11/91/4712/21/16 1711 11/05/15 0502 11/06/15 0306  NA 140 137 138  K 4.9 3.8 3.9  CL 108 106 104  CO2 22 20* 22  GLUCOSE 119* 155* 119*  BUN 14 14 13   CREATININE 1.18 1.09 1.11  CALCIUM 8.7* 8.6* 8.7*  MG 2.1  --  1.9   Liver Function Tests:  Recent Labs  11/04/15 1711 11/06/15 0306  AST 29 69*  ALT 25 37  ALKPHOS 100 81  BILITOT 0.8 0.6  PROT 5.7* 6.2*  ALBUMIN 3.5 3.5   No results for input(s): LIPASE, AMYLASE in the last 72 hours. CBC:  Recent Labs  11/04/15 1711  11/05/15 0713 11/06/15 0306  WBC 13.6*  < > 15.5* 13.8*  NEUTROABS 9.7*  --   --   --   HGB 14.7  < > 14.8 14.9  HCT 43.4  < > 43.4 43.4  MCV 92.1  < > 92.1 91.6  PLT 238  < > 237 241  < > = values in this interval not displayed. Cardiac Enzymes:  Recent Labs  11/04/15 1711 11/04/15 2200 11/05/15 0502  TROPONINI 1.01* 15.22* 10.30*  11.46*   BNP: Invalid  input(s): POCBNP D-Dimer: No results for input(s): DDIMER in the last 72 hours. Hemoglobin A1C:  Recent Labs  11/04/15 1711  HGBA1C 6.5*   Fasting Lipid Panel:  Recent Labs  11/05/15 0502  CHOL 239*  HDL 44  LDLCALC 172*  TRIG 113  CHOLHDL 5.4   Thyroid Function Tests:  Recent Labs  11/04/15 1711  TSH 1.566   Anemia Panel: No results for input(s): VITAMINB12, FOLATE, FERRITIN, TIBC, IRON, RETICCTPCT in the last 72 hours. Coag Panel:   Lab Results  Component Value Date   INR 1.06 11/05/2015   INR 5.10* 11/04/2015   INR 0.96 11/04/2015    RADIOLOGY: Dg Chest Port 1 View  11/06/2015  CLINICAL DATA:  Dyspnea. EXAM: PORTABLE CHEST 1 VIEW COMPARISON:  11/05/2015 FINDINGS: The patient is slightly rotated to the right. Cardiac silhouette is upper limits of normal in size. Increased interstitial markings on the prior study have resolved. There is no evidence of confluent airspace opacity, pleural effusion, or pneumothorax. IMPRESSION: Decreased interstitial prominence suggestive of resolved  edema. No consolidation. Electronically Signed   By: Sebastian Ache M.D.   On: 11/06/2015 08:06   Dg Chest Port 1 View  11/05/2015  CLINICAL DATA:  Increasing shortness of breath EXAM: PORTABLE CHEST 1 VIEW COMPARISON:  None. FINDINGS: No cardiomegaly for technique.  Negative aortic and hilar contours. Interstitial prominence with probable Kerley lines. No focal consolidation. No pneumothorax. Lateral costophrenic sulci are incompletely visualized ; no detected pleural fluid. IMPRESSION: Interstitial coarsening- suspect developing pulmonary edema. Electronically Signed   By: Marnee Spring M.D.   On: 11/05/2015 04:16    ASSESSMENT AND PLAN   1. Acute ST elevation myocardial infarction (STEMI) of anterior wall (HCC) - emergent cath showed 100% mid stenosed LAD s/p PTCA and DES and 50- 60% hypodense ulcer ulcerated mid dominant RCA stenosis that did not appear to be flow-limiting.  Had CP  for 24 hours after cath but now resolved. Troponin trending downward.   2D echo showed EF 45% to 50% with hypokinesis of the mid-apicalanteroseptal andapical myocardium. - continue ASA , statin, BB and brilinta.  - stop IV Heparin gtt and change to SQ Heparin for DVT prophylaxis - transfer to floor bed  2. Nonsustained ventricular tachycardia in setting of acute MI.  Now resolved with no further episodes.  EF 45-50% by echo.   - Continue metoprolol 12.5mg  BID. BP soft low.  - stop IV Amio gtt  3. Dyslipidemia - 11/05/2015: Cholesterol 239*; HDL 44; LDL Cholesterol 172*; Triglycerides 113; VLDL 23  - Continue stain  - Consider OP f/u labs 6-8 weeks given statin initiation this admission.  4. Leukocytosis - WBC trending down. No fever but has had cough and chest congestion for a few days PTA.  Will treat with a Zpack.  5. HgbA1c of 6.5 with elevated hyperglycemia - F/u with PCP for lifestyle modification vs medical management  6. Tobacco abuse - Advice smoking cessation. Education given   7.  Acute combined systolic/diastolic CHF - SOB has responded to Lasix. He has put out 1.8L and lungs are clear today.  - Will change IV to PO Lasix  daily.   - continue BB.  No ACE or ARB due to soft BP  Stable for transfer to tele bed.  Probable d/c home in am.   Quintella Reichert, MD  11/06/2015  10:11 AM

## 2015-11-06 NOTE — Progress Notes (Signed)
Report called to RN on [facility]. Patient will transport in wheelchair with wife and all belongings to [facility] room 33.

## 2015-11-06 NOTE — Plan of Care (Signed)
Cardiology [facility]  Called to evaluate the pt due to orthopnea with the pt requesting Lasix as he received it twice earlier today with improvement in orthopnea & chest discomfort.  He is not currently complaining of chest discomfort but is unable to fall asleep due to orthopnea.  He is not hypoxic.  He is HD stable.  PE reveals JVD 6 cm above the clavicle, mild rales, & trace PE b/l LE.  CXR earlier today revealed pulmonary edema.  It is odd that he is requiring Lasix 40 mg IV x 3 times today given that he is Lasix naive with less than expected UOP.  Have given an additional dose now, simultaneously ordering repeat labs & a repeat CXR.  Roger Morinlivia Zephyr Sausedo, MD

## 2015-11-06 NOTE — Progress Notes (Signed)
CARDIAC REHAB PHASE I   PRE:  Rate/Rhythm: 94 SR  BP:  Sitting: 110/56        SaO2: 97 RA  MODE:  Ambulation: 1050 ft   POST:  Rate/Rhythm: 96 SR  BP:  Sitting: 122/76         SaO2: 98 RA  Pt ambulated 1050 ft on RA, IV, independent, steady gait, tolerated well, states he feels better today.  Pt denies cp, dizziness, DOE, declined rest stop. Completed MI/stent education with pt and wife.  Reviewed anti-platelet therapy, ntg, exercise, heart healthy diet, portion control, s/s heart failure, daily weights, and sodium restrictions. Pt and wife verbalized understanding, receptive to education. Pt to recliner after walk, call bell within reach. Will follow.  [id number] Joylene GrapesMonge, Marlyne Totaro C, RN, BSN 11/06/2015 10:23 AM

## 2015-11-06 NOTE — Progress Notes (Signed)
Paged Garner Gavelraci Tunner MD about pts chest pain. One nitro was given and 2L oxygen, pt stated no relief and does not want more nitro. Will continue to monitor. Tarri GlennFolashade Alabi, RN

## 2015-11-07 DIAGNOSIS — R0602 Shortness of breath: Secondary | ICD-10-CM

## 2015-11-07 DIAGNOSIS — I2102 ST elevation (STEMI) myocardial infarction involving left anterior descending coronary artery: Principal | ICD-10-CM

## 2015-11-07 DIAGNOSIS — E785 Hyperlipidemia, unspecified: Secondary | ICD-10-CM

## 2015-11-07 LAB — URINALYSIS, DIPSTICK ONLY
GLUCOSE, UA: NEGATIVE mg/dL
Hgb urine dipstick: NEGATIVE
Ketones, ur: 15 mg/dL — AB
Leukocytes, UA: NEGATIVE
Nitrite: NEGATIVE
PROTEIN: 30 mg/dL — AB
Specific Gravity, Urine: 1.03 (ref 1.005–1.030)
pH: 5.5 (ref 5.0–8.0)

## 2015-11-07 LAB — CBC
HCT: 41 % (ref 39.0–52.0)
HEMOGLOBIN: 14.2 g/dL (ref 13.0–17.0)
MCH: 31.9 pg (ref 26.0–34.0)
MCHC: 34.6 g/dL (ref 30.0–36.0)
MCV: 92.1 fL (ref 78.0–100.0)
PLATELETS: 226 10*3/uL (ref 150–400)
RBC: 4.45 MIL/uL (ref 4.22–5.81)
RDW: 13.4 % (ref 11.5–15.5)
WBC: 16.2 10*3/uL — ABNORMAL HIGH (ref 4.0–10.5)

## 2015-11-07 LAB — D-DIMER, QUANTITATIVE (NOT AT ARMC)

## 2015-11-07 MED ORDER — METOPROLOL TARTRATE 25 MG PO TABS
25.0000 mg | ORAL_TABLET | Freq: Two times a day (BID) | ORAL | Status: DC
  Administered 2015-11-07 (×2): 25 mg via ORAL
  Filled 2015-11-07 (×3): qty 1

## 2015-11-07 MED ORDER — PRASUGREL HCL 10 MG PO TABS
10.0000 mg | ORAL_TABLET | Freq: Every day | ORAL | Status: DC
  Administered 2015-11-08: 10 mg via ORAL
  Filled 2015-11-07: qty 1

## 2015-11-07 MED ORDER — PRASUGREL HCL 10 MG PO TABS
60.0000 mg | ORAL_TABLET | Freq: Once | ORAL | Status: AC
  Administered 2015-11-07: 60 mg via ORAL
  Filled 2015-11-07: qty 6

## 2015-11-07 NOTE — Progress Notes (Signed)
CARDIAC REHAB PHASE I   Pt lying in bed, on oxygen, wife at bedside. Pt states he has been experiencing chest and back pain as well as shortness of breath. Pt states he has received several doses of morphine which has helped some, reports no relief with nitro. Pt declines ambulation at this time. Education completed yesterday with pt and wife, today they state they have no questions, are just anxious to determine cause of pt's pain/shortness of breath. Will follow up later this morning as schedule permits, otherwise encouraged ambulation as tolerated, pt verbalized understanding. Pt in bed, call bell within reach.  Joylene GrapesMonge, Davidmichael Zarazua C, RN, BSN 11/07/2015 8:47 AM

## 2015-11-07 NOTE — Progress Notes (Signed)
Patient Name: Roger [name] Date of Encounter: 11/07/2015   SUBJECTIVE  Continue to have chest discomfort and now with pain between should blades. Similar feeling that led him to ED presentation.  Overnight on supplemental oxygen and breathing treatment. Received tylenol x 2 and IV morphine x 3 overnight. Breathing stable now.   CURRENT MEDS . aspirin EC  81 mg Oral Daily  . atorvastatin  80 mg Oral q1800  . azithromycin  250 mg Oral Daily  . enoxaparin (LOVENOX) injection  40 mg Subcutaneous Q24H  . furosemide  20 mg Oral Daily  . metoprolol tartrate  12.5 mg Oral BID  . potassium chloride  10 mEq Oral Daily  . sodium chloride  3 mL Intravenous Q12H  . ticagrelor  90 mg Oral BID    OBJECTIVE  Filed Vitals:   11/06/15 1715 11/06/15 1953 11/06/15 2125 11/07/15 0344  BP: 110/67 114/67 112/74 112/71  Pulse: 97 102 115 108  Temp:    98.5 F (36.9 C)  TempSrc:    Oral  Resp:  19  18  Height:      Weight:    203 lb 14.8 oz (92.5 kg)  SpO2:  100%  96%    Intake/Output Summary (Last 24 hours) at 11/07/15 0824 Last data filed at 11/06/15 1852  Gross per 24 hour  Intake 589.81 ml  Output     75 ml  Net 514.81 ml   Filed Weights   11/04/15 1645 11/06/15 0500 11/07/15 0344  Weight: 212 lb 8.4 oz (96.4 kg) 204 lb 5.9 oz (92.7 kg) 203 lb 14.8 oz (92.5 kg)    PHYSICAL EXAM  General: Pleasant, NAD. Neuro: Alert and oriented X 3. Moves all extremities spontaneously. Psych: Normal affect. HEENT:  Normal  Neck: Supple without bruits or JVD. Lungs:  Resp regular and unlabored, CTA. Heart: RRR no s3, s4, or murmurs. Abdomen: Soft, non-tender, non-distended, BS + x 4.  Extremities: No clubbing, cyanosis or edema. DP/PT/Radials 2+ and equal bilaterally.  Accessory Clinical Findings  CBC  Recent Labs  11/04/15 1711  11/06/15 0306 11/07/15 0223  WBC 13.6*  < > 13.8* 16.2*  NEUTROABS 9.7*  --   --   --   HGB 14.7  < > 14.9 14.2  HCT 43.4  < > 43.4 41.0  MCV 92.1   < > 91.6 92.1  PLT 238  < > 241 226  < > = values in this interval not displayed. Basic Metabolic Panel  Recent Labs  11/04/15 1711 11/05/15 0502 11/06/15 0306  NA 140 137 138  K 4.9 3.8 3.9  CL 108 106 104  CO2 22 20* 22  GLUCOSE 119* 155* 119*  BUN CREATININE 1.18 1.09 1.11  CALCIUM 8.7* 8.6* 8.7*  MG 2.1  --  1.9   Liver Function Tests  Recent Labs  11/04/15 1711 11/06/15 0306  AST 29 69*  ALT 25 37  ALKPHOS 100 81  BILITOT 0.8 0.6  PROT 5.7* 6.2*  ALBUMIN 3.5 3.5   No results for input(s): LIPASE, AMYLASE in the last 72 hours. Cardiac Enzymes  Recent Labs  11/04/15 1711 11/04/15 2200 11/05/15 0502  TROPONINI 1.01* 15.22* 10.30*  11.46*   Hemoglobin A1C  Recent Labs  11/04/15 1711  HGBA1C 6.5*   Fasting Lipid Panel  Recent Labs  11/05/15 0502  CHOL 239*  HDL 44  LDLCALC 172*  TRIG 113  CHOLHDL 5.4   Thyroid Function Tests  Recent  Labs  11/04/15 1711  TSH 1.566    TELE  Sinus rhythm at rate of 90s-100s.   Radiology/Studies  Dg Chest Port 1 View  11/06/2015  CLINICAL DATA:  Dyspnea. EXAM: PORTABLE CHEST 1 VIEW COMPARISON:  11/05/2015 FINDINGS: The patient is slightly rotated to the right. Cardiac silhouette is upper limits of normal in size. Increased interstitial markings on the prior study have resolved. There is no evidence of confluent airspace opacity, pleural effusion, or pneumothorax. IMPRESSION: Decreased interstitial prominence suggestive of resolved edema. No consolidation. Electronically Signed   By: Sebastian AcheAllen  Grady M.D.   On: 11/06/2015 08:06   Dg Chest Port 1 View  11/05/2015  CLINICAL DATA:  Increasing shortness of breath EXAM: PORTABLE CHEST 1 VIEW COMPARISON:  None. FINDINGS: No cardiomegaly for technique.  Negative aortic and hilar contours. Interstitial prominence with probable Kerley lines. No focal consolidation. No pneumothorax. Lateral costophrenic sulci are incompletely visualized ; no detected pleural  fluid. IMPRESSION: Interstitial coarsening- suspect developing pulmonary edema. Electronically Signed   By: Marnee SpringJonathon  Watts M.D.   On: 11/05/2015 04:16    ASSESSMENT AND PLAN  1. Acute ST elevation myocardial infarction (STEMI) of anterior wall (HCC) - emergent cath showed 100% mid stenosed LAD s/p PTCA and DES and 50- 60% hypodense ulcer ulcerated mid dominant RCA stenosis that did not appear to be flow-limiting. Had CP for 24 hours after cath but resolved, now came back since yesterday. Troponin trending downward. 2D echo showed EF 45% to 50% with hypokinesis of the mid-apicalanteroseptal andapical myocardium. - continue ASA , statin, BB and brilinta.  - IV Heparin gtt DC yesterday and changeed to SQ Heparin for DVT prophylaxis - He continue to have chest pain, similar that lead him to ED presentation. No improvement on IV Morphine. Will check D-dimer. Less likely pericarditis given no pleuritic pain, however had URI prior to admission. Can stop ASA and start on prophylactic NSAID and colchicine.   2. Nonsustained ventricular tachycardia in setting of acute MI. Now resolved with no further episodes. EF 45-50% by echo.  - ON metoprolol 12.5mg  BID. BP was soft low. Now stable. Tele with sinus tachy at rate of 90-100s. Consider increasing dose.  - stoped IV Amio gtt  3. Dyslipidemia - 11/05/2015: Cholesterol 239*; HDL 44; LDL Cholesterol 172*; Triglycerides 113; VLDL 23  - Continue stain  - Consider OP f/u labs 6-8 weeks given statin initiation this admission.  4. Leukocytosis - WBC trend down. However up today 16.2. o No fever but has had cough and chest congestion for a few days PTA. Started on Zpack. Will check UA.   5. HgbA1c of 6.5 with elevated hyperglycemia - F/u with PCP for lifestyle modification vs medical management  6. Tobacco abuse - Advice smoking cessation. Education given   7. Acute combined systolic/diastolic CHF - SOB has responded to Lasix. He has put out  1.3L and lungs are clear today.  - Continue PO Lasix 20mg  daily.  - continue BB. No ACE or ARB due to soft BP. AS above.    Signed, Advertising account executiveBhagat,Bhavinkumar PA-C Pager [phone]  Agree with note by Chelsea AusVin Bhagat PA-C  POD # 4 Ant STEMI complicated by VF requiring Defib. PCI/DES prox LAD with residual mod mid dominant RCA. Mild to mod LV dysfunction. C/O Pleuritic CP and back pain. He did transiently improve with IV lasix. On PO lasix now. His SOB may be secondary to [place]. His Sx are out of proportion to the degree of LV dysfunction. Will D/C Brilenta  and change to Effient. If still has pleuritic CP after several days may want to Rx empirically with NSAIDs for post MI pericarditis.   Runell Gess, M.D., FACP, [facility], [facility] [address] [place], [place]  [address]  [phone] 11/07/2015 9:04 AM

## 2015-11-07 NOTE — Progress Notes (Signed)
Patient lying in bed, no distress pain level at a 3. Call light within reach.

## 2015-11-08 ENCOUNTER — Encounter (HOSPITAL_COMMUNITY): Payer: Self-pay | Admitting: Physician Assistant

## 2015-11-08 DIAGNOSIS — I2510 Atherosclerotic heart disease of native coronary artery without angina pectoris: Secondary | ICD-10-CM

## 2015-11-08 DIAGNOSIS — I255 Ischemic cardiomyopathy: Secondary | ICD-10-CM

## 2015-11-08 DIAGNOSIS — I4901 Ventricular fibrillation: Secondary | ICD-10-CM

## 2015-11-08 DIAGNOSIS — Z9861 Coronary angioplasty status: Secondary | ICD-10-CM

## 2015-11-08 DIAGNOSIS — R739 Hyperglycemia, unspecified: Secondary | ICD-10-CM

## 2015-11-08 DIAGNOSIS — I241 Dressler's syndrome: Secondary | ICD-10-CM

## 2015-11-08 DIAGNOSIS — I5042 Chronic combined systolic (congestive) and diastolic (congestive) heart failure: Secondary | ICD-10-CM

## 2015-11-08 DIAGNOSIS — D72829 Elevated white blood cell count, unspecified: Secondary | ICD-10-CM

## 2015-11-08 LAB — BASIC METABOLIC PANEL
Anion gap: 10 (ref 5–15)
BUN: 20 mg/dL (ref 6–20)
CHLORIDE: 103 mmol/L (ref 101–111)
CO2: 22 mmol/L (ref 22–32)
Calcium: 8.8 mg/dL — ABNORMAL LOW (ref 8.9–10.3)
Creatinine, Ser: 1.16 mg/dL (ref 0.61–1.24)
GFR calc Af Amer: >60 mL/min (ref >60)
GFR calc non Af Amer: >60 mL/min (ref >60)
GLUCOSE: 117 mg/dL — AB (ref 65–99)
POTASSIUM: 3.9 mmol/L (ref 3.5–5.1)
Sodium: 135 mmol/L (ref 135–145)

## 2015-11-08 LAB — CBC
HEMATOCRIT: 41 % (ref 39.0–52.0)
HEMOGLOBIN: 13.8 g/dL (ref 13.0–17.0)
MCH: 31.3 pg (ref 26.0–34.0)
MCHC: 33.7 g/dL (ref 30.0–36.0)
MCV: 93 fL (ref 78.0–100.0)
Platelets: 222 10*3/uL (ref 150–400)
RBC: 4.41 MIL/uL (ref 4.22–5.81)
RDW: 13.3 % (ref 11.5–15.5)
WBC: 16.1 10*3/uL — ABNORMAL HIGH (ref 4.0–10.5)

## 2015-11-08 MED ORDER — METOPROLOL TARTRATE 25 MG PO TABS: 25.0000 mg | ORAL_TABLET | Freq: Two times a day (BID) | ORAL | Status: DC

## 2015-11-08 MED ORDER — IBUPROFEN 600 MG PO TABS: 600.0000 mg | ORAL_TABLET | Freq: Two times a day (BID) | ORAL | Status: DC

## 2015-11-08 MED ORDER — POTASSIUM CHLORIDE CRYS ER 10 MEQ PO TBCR: 10.0000 meq | EXTENDED_RELEASE_TABLET | Freq: Every day | ORAL | Status: DC

## 2015-11-08 MED ORDER — IBUPROFEN 600 MG PO TABS
600.0000 mg | ORAL_TABLET | Freq: Two times a day (BID) | ORAL | Status: DC
  Administered 2015-11-08: 600 mg via ORAL
  Filled 2015-11-08: qty 1

## 2015-11-08 MED ORDER — PANTOPRAZOLE SODIUM 40 MG PO TBEC: 40.0000 mg | DELAYED_RELEASE_TABLET | Freq: Every day | ORAL | Status: DC

## 2015-11-08 MED ORDER — FUROSEMIDE 20 MG PO TABS: 20.0000 mg | ORAL_TABLET | Freq: Every day | ORAL | Status: DC

## 2015-11-08 MED ORDER — PRASUGREL HCL 10 MG PO TABS: 10.0000 mg | ORAL_TABLET | Freq: Every day | ORAL | Status: DC

## 2015-11-08 MED ORDER — AZITHROMYCIN 250 MG PO TABS: ORAL_TABLET | ORAL | Status: DC

## 2015-11-08 MED ORDER — AZITHROMYCIN 250 MG PO TABS: 250.0000 mg | ORAL_TABLET | Freq: Every day | ORAL | Status: DC

## 2015-11-08 MED ORDER — LIVING BETTER WITH HEART FAILURE BOOK
Freq: Once | Status: AC
  Administered 2015-11-08: 13:00:00

## 2015-11-08 MED ORDER — NITROGLYCERIN 0.4 MG SL SUBL: 0.4000 mg | SUBLINGUAL_TABLET | SUBLINGUAL | Status: DC | PRN

## 2015-11-08 MED ORDER — ATORVASTATIN CALCIUM 80 MG PO TABS
80.0000 mg | ORAL_TABLET | Freq: Every evening | ORAL | Status: DC
Start: 2015-11-08 — End: 2016-02-16

## 2015-11-08 MED ORDER — ASPIRIN 81 MG PO TBEC: 81.0000 mg | DELAYED_RELEASE_TABLET | Freq: Every day | ORAL | Status: AC

## 2015-11-08 NOTE — Progress Notes (Signed)
Subjective:  POD # 5 Ant STEMI Rx with DES LAD with mod residual mid dominant RCA Dz. CP and SOB improved  Objective:  Temp:  [98.1 F (36.7 C)-98.3 F (36.8 C)] 98.3 F (36.8 C) (12/25 0604) Pulse Rate:  [100-104] 104 (12/25 0604) Resp:  [17-18] 18 (12/25 0604) BP: (98-110)/(60-66) 106/60 mmHg (12/25 0604) SpO2:  [98 %-99 %] 98 % (12/25 0604) Weight:  [205 lb (92.987 kg)] 205 lb (92.987 kg) (12/25 0604) Weight change: 1 lb 1.2 oz (0.487 kg)  Intake/Output from previous day: 12/24 0701 - 12/25 0700 In: 726 [P.O.:720; I.V.:6] Out: -   Intake/Output from this shift:    Physical Exam: General appearance: alert and no distress Neck: no adenopathy, no carotid bruit, no JVD, supple, symmetrical, trachea midline and thyroid not enlarged, symmetric, no tenderness/mass/nodules Lungs: clear to auscultation bilaterally Heart: regular rate and rhythm, S1, S2 normal, no murmur, click, rub or gallop Extremities: extremities normal, atraumatic, no cyanosis or edema  Lab Results: Results for orders placed or performed during the hospital encounter of 11/04/15 (from the past 48 hour(s))  CBC     Status: Abnormal   Collection Time: 11/07/15  2:23 AM  Result Value Ref Range   WBC 16.2 (H) 4.0 - 10.5 K/uL   RBC 4.45 4.22 - 5.81 MIL/uL   Hemoglobin 14.2 13.0 - 17.0 g/dL   HCT 41.0 39.0 - 52.0 %   MCV 92.1 78.0 - 100.0 fL   MCH 31.9 26.0 - 34.0 pg   MCHC 34.6 30.0 - 36.0 g/dL   RDW 13.4 11.5 - 15.5 %   Platelets 226 150 - 400 K/uL  Urinalysis, dipstick only     Status: Abnormal   Collection Time: 11/07/15 10:09 AM  Result Value Ref Range   Color, Urine AMBER (A) YELLOW   APPearance CLEAR CLEAR   Specific Gravity, Urine 1.030 1.005 - 1.030   pH 5.5 5.0 - 8.0   Glucose, UA NEGATIVE NEGATIVE mg/dL   Hgb urine dipstick NEGATIVE NEGATIVE   Bilirubin Urine SMALL (A) NEGATIVE   Ketones, ur 15 (A) NEGATIVE mg/dL   Protein, ur 30 (A) NEGATIVE mg/dL   Nitrite NEGATIVE NEGATIVE   Leukocytes, UA NEGATIVE NEGATIVE  D-dimer, quantitative (not at [facility])     Status: None   Collection Time: 11/07/15 10:55 AM  Result Value Ref Range   D-Dimer, Quant <0.27 0.00 - 0.50 ug/mL-FEU    Comment: (NOTE) At the manufacturer cut-off of 0.50 ug/mL FEU, this assay has been documented to exclude PE with a sensitivity and negative predictive value of 97 to 99%.  At this time, this assay has not been approved by the FDA to exclude DVT/VTE. Results should be correlated with clinical presentation.   CBC     Status: Abnormal   Collection Time: 11/08/15  3:11 AM  Result Value Ref Range   WBC 16.1 (H) 4.0 - 10.5 K/uL   RBC 4.41 4.22 - 5.81 MIL/uL   Hemoglobin 13.8 13.0 - 17.0 g/dL   HCT 41.0 39.0 - 52.0 %   MCV 93.0 78.0 - 100.0 fL   MCH 31.3 26.0 - 34.0 pg   MCHC 33.7 30.0 - 36.0 g/dL   RDW 13.3 11.5 - 15.5 %   Platelets 222 150 - 400 K/uL  Basic metabolic panel     Status: Abnormal   Collection Time: 11/08/15  3:11 AM  Result Value Ref Range   Sodium 135 135 - 145 mmol/L   Potassium 3.9  3.5 - 5.1 mmol/L   Chloride 103 101 - 111 mmol/L   CO2 22 22 - 32 mmol/L   Glucose, Bld 117 (H) 65 - 99 mg/dL   BUN 20 6 - 20 mg/dL   Creatinine, Ser 1.16 0.61 - 1.24 mg/dL   Calcium 8.8 (L) 8.9 - 10.3 mg/dL   GFR calc non Af Amer >60 >60 mL/min   GFR calc Af Amer >60 >60 mL/min    Comment: (NOTE) The eGFR has been calculated using the CKD EPI equation. This calculation has not been validated in all clinical situations. eGFR's persistently <60 mL/min signify possible Chronic Kidney Disease.    Anion gap 10 5 - 15    Imaging: Imaging results have been reviewed  Tele: NSR 90s  Assessment/Plan:   1. Active Problems: 2.   Hyperlipidemia 3.   Smoker 4.   ST elevation myocardial infarction (STEMI) of anterior wall ([place]) 5.   ST elevation (STEMI) myocardial infarction involving left anterior descending coronary artery ([place]) 6.   STEMI (ST elevation myocardial infarction) ([place]) 7.    Shortness of breath 8.   Time Spent Directly with Patient:  20 minutes  Length of Stay:  LOS: 4 days   POD # 5 Ant STEMI Rx with DES to prox LAD. There is residual mod mid dominant RCA Dz which will be Rx medically. SOB improved with changing Brilenta to Effient. Otherwise on approp meds. Can prob titrate BB as OP at [facility] 7. Not currently on ACE-I secondary to soft BP. EF 45% by 2D. OK for DC home this AM. TOC 7 then ROV with me 3-4 weeks  Quay Burow 11/08/2015, 9:02 AM

## 2015-11-08 NOTE — Progress Notes (Signed)
D/C teaching reviewed/ completed with patient and his wife including reviewing AVS printout, Rx's, and CHF book. CCMD notified and taken off tellie - leads removed and IV site covered to take a shower. Identified pharmacy open where they can fill Rx today, [place].

## 2015-11-08 NOTE — Care Management (Signed)
CM provided patient with Effient 30 day free trial savings card. Patient instructed on how to redeem, teach back done patient verbalized understanding. No further CM needs identified.

## 2015-11-08 NOTE — Progress Notes (Signed)
Patient complaining of coughing and pain. Gave morphine, Tylenol and robitussin. Call light within reach.

## 2015-11-08 NOTE — Discharge Summary (Signed)
Discharge Summary   Patient ID: Roger [name],  MRN: [id number], DOB/AGE: 06/25/1951 64 y.o.  Admit date: 11/04/2015 Discharge date: 11/08/2015  [facility] Primary Cardiologist: Dr. Allyson SabalBerry  Discharge Diagnoses    Active Problems:   ST elevation (STEMI) myocardial infarction involving left anterior descending coronary artery [facility](HCC)   CAD S/P percutaneous coronary angioplasty   Hyperlipidemia   Tobacco abuse   Shortness of breath   Cardiomyopathy, ischemic   Acute on chronic combined systolic and diastolic CHF (congestive heart failure) (HCC)   NSVT (nonsustained ventricular tachycardia) (HCC)   Ventricular fibrillation (HCC)   Hyperglycemia   Leukocytosis   Dressler's syndrome (HCC)   Allergies No Known Allergies  Diagnostic Studies/Procedures    1) Cardiac catheterization this admission, please see full report and below for summary. 2) 2D echo 11/05/15: - Left ventricle: The cavity size was normal. Wall thickness wasincreased in a pattern of mild LVH. Systolic function was mildly reduced. The estimated ejection fraction was in the range of 45%to 50%. There is hypokinesis of the mid-apicalanteroseptal andapical myocardium. Doppler parameters are consistent withabnormal left ventricular relaxation (grade 1 diastolic dysfunction). Doppler parameters are consistent with highventricular filling pressure. Impressions:- Hypokinesis of the distal septum and apex with overall mildlyreduced LV function; grade 1 diastolic dysfunction with elevatedLV filling pressure; trace MR.  _____________   History of Present Illness/Hospital Course  Mr. Roger [name] is a 64 y/o M with history of dyslipidemia (patient had stopped statin) and tobacco abuse who [facility] with chest pain and NSTEMI. He had been having chest pain for the past week off and on.While at work his symptoms worsened with sharp pain into his back associated  with severe diaphoresis and nausea. He drove himself to the ER where he was profoundly diaphoretic with 8/10 CP and ashen appearing. EKG showed 4-665mm of ST elevation in V1-V3 and I and aVL with marked ST depression in the inferior leads.He had NSVT on the monitor on the way to the cath lab - was taken emergently for coronary angiography. LHC showed occluded proximal LAD with residual 50-60% hypodense ulcer ulcerated mid dominant RCA stenosis that did not appear to be flow-limiting. LAD was treated with PTCA/DES.  He fibrillated at after stent deployment requiring defibrillation twice. CPR was not performed. The patient was alert and awake at the end of the case. His RCA will be treated medically with consideration for outpatient Myoview once he recovers from MI. He was started on standard post-MI therapy. Post-cath he continued to have some CP, SOB, & elevated troponin - he was restarted on heparin for a period of time. He also reported cough/diaphoresis and was started on nebulizers. (Of note the patient reported he had been recovering from a cold/URI for the past 2 weeks. He was started on a Zpack for presumed residual bronchitis.) He also developed orthopnea with pulm edema on CXR likely representing acute combined CHF and required short duration of IV Lasix -> transitioned to oral Lasix. 2D Echo 11/05/15: mild LVH, EF 45-50%, hypokinesis of the mid-apicalanteroseptal and apical myocardium, high ventricular filling pressure, grade 1 DD, trace MR. Despite these measures he continued to have periodic SOB felt out of proportion to LV dysfunction. D-dimer was negative. Brilinta was changed to Effient with improvement in dyspnea. This morning he feels better. He is not on ACEI/ARB due to softer BP. The patient did report positional and pleuritic CP felt to represent mild case of Dressler's syndrome. Per d/w Dr. Allyson SabalBerry,  will start ibuprofen  BID. Advised patient take this with food - also rx'd protonix to take  while on this medication. At his f/u appointment it will need to be determined if he needs to continue taking this - was prescribed 10 day course and will be seen in 7 days. We did not prescribe colchicine given limited data in this setting. Dr. Allyson Sabal has seen and examined the patient today and feels he is stable for discharge. I have sent a message to our [facility] office's scheduler requesting a follow-up appointment, and our office will call the patient with this information. Rxs were printed out given the holiday in case his pharmacy of choice is closed. He was asked not to return to work or drive until cleared by cardiologist.   Issues for f/u: - WBC was persistently elevated this admission (13-16 range) and may need to be repeated at f/u. He will complete his course of azithromycin on 11/10/15.  - AST was 69 likely due to MI but would also consider repeating at f/u appt. LDL was 172 - started on high dose statin. If the patient is tolerating statin at time of follow-up appointment, would consider rechecking liver function/lipid panel in 6-8 weeks. - A1C was 6.5 likely indicating new diagnosis of DM - patient instructed to f/u PCP to discuss rx (meds vs lifestyle modification)  Consultants: N/A _____________  Discharge Vitals Blood pressure 106/60, pulse 104, temperature 98.3 F (36.8 C), temperature source Oral, resp. rate 18, height  (1.778 m), weight 205 lb (92.987 kg), SpO2 98 %.  Filed Weights   11/06/15 0500 11/07/15 0344 11/08/15 0604  Weight: 204 lb 5.9 oz (92.7 kg) 203 lb 14.8 oz (92.5 kg) 205 lb (92.987 kg)   _____________  Labs     CBC  Recent Labs  11/07/15 0223 11/08/15 0311  WBC 16.2* 16.1*  HGB 14.2 13.8  HCT 41.0 41.0  MCV 92.1 93.0  PLT 226 222   Basic Metabolic Panel  Recent Labs  11/06/15 0306 11/08/15 0311  NA 138 135  K 3.9 3.9  CL 104 103  CO2 22 22  GLUCOSE 119* 117*  BUN 13 20  CREATININE 1.11 1.16  CALCIUM 8.7* 8.8*  MG 1.9  --     Liver Function Tests  Recent Labs  11/06/15 0306  AST 69*  ALT 37  ALKPHOS 81  BILITOT 0.6  PROT 6.2*  ALBUMIN 3.5    Recent Labs  11/07/15 1055  DDIMER <0.27   _____________  Disposition   Pt is being discharged home today in good condition. _____________  Follow-up Plans & Appointments    Follow-up Information    Follow up with [facility] TOM, MD.   Specialty:  Family Medicine   Why:  Your A1C test was elevated, indicating high blood sugar (A1C 6.5). Please discuss further treatment and monitoring with your primary doctor.   Contact information:   [address] [place] [address] [place] [address] [phone]       Follow up with Nanetta Batty, MD.   Specialties:  Cardiology, Radiology   Why:  Office will call you for your followup appointment. Call office if you have not heard back in 3 days. Your appoinment may be at a different address so please make sure to clarify when they call.   Contact information:   [address] [place] [address] [phone]      Discharge Instructions    AMB Referral to Cardiac Rehabilitation -  Phase II    Complete by:  As directed   Diagnosis:  Myocardial Infarction     Diet - low sodium heart healthy    Complete by:  As directed      Increase activity slowly    Complete by:  As directed   No driving until cleared by your cardiologist. No lifting over 10 lbs for 4 weeks. No sexual activity for 4 weeks. You may not return to work until cleared by your cardiologist. Keep procedure site clean & dry. If you notice increased pain, swelling, bleeding or pus, call/return!  You may shower, but no soaking baths/hot tubs/pools for 1 week.           Discharge Medications   Current Discharge Medication List    START taking these medications   Details  aspirin EC 81 MG EC tablet Take 1 tablet (81 mg total) by mouth daily. Qty: 30 tablet, Refills: 11    atorvastatin (LIPITOR) 80 MG tablet Take 1 tablet (80  mg total) by mouth every evening. Qty: 30 tablet, Refills: 6    azithromycin (ZITHROMAX) 250 MG tablet Take 1 tablet (250 mg total) by mouth daily. Will finish course on 11/10/15 Qty: 2 each, Refills: 0    furosemide (LASIX) 20 MG tablet Take 1 tablet (20 mg total) by mouth daily. Qty: 30 tablet, Refills: 6    ibuprofen (ADVIL,MOTRIN) 600 MG tablet Take 1 tablet (600 mg total) by mouth 2 (two) times daily. Qty: 20 tablet, Refills: 0    metoprolol tartrate (LOPRESSOR) 25 MG tablet Take 1 tablet (25 mg total) by mouth 2 (two) times daily. Qty: 60 tablet, Refills: 6    nitroGLYCERIN (NITROSTAT) 0.4 MG SL tablet Place 1 tablet (0.4 mg total) under the tongue every 5 (five) minutes as needed for chest pain (up to 3 doses). Qty: 25 tablet, Refills: 3    pantoprazole (PROTONIX) 40 MG tablet Take 1 tablet (40 mg total) by mouth daily. While taking ibuprofen. Qty: 30 tablet, Refills: 0    potassium chloride (K-DUR,KLOR-CON) 10 MEQ tablet Take 1 tablet (10 mEq total) by mouth daily. Qty: 30 tablet, Refills: 6    prasugrel (EFFIENT) 10 MG TABS tablet Take 1 tablet (10 mg total) by mouth daily. Qty: 30 tablet, Refills: 11      CONTINUE these medications which have NOT CHANGED   Details  Cholecalciferol (VITAMIN D3) 1000 UNITS CAPS Take 1,000 Units by mouth daily as needed (takes occasionally).      STOP taking these medications     Aspirin-Acetaminophen-Caffeine (GOODY HEADACHE PO)          Aspirin prescribed at discharge:  Yes High Intensity Statin Prescribed? (Lipitor 40-80mg  or Crestor 20-40mg ): Yes Beta Blocker Prescribed: Yes ADP Receptor Inhibitor Prescribed? (i.e. Plavix etc.-Includes Medically Managed Patients): Yes For EF <40%, Aldosterone Inhibitor Prescribed? No: EF>40 Was EF assessed during THIS hospitalization? Yes Was Cardiac Rehab II ordered? (Included Medically managed Patients): Yes _____________   Outstanding Labs/Studies   See above.  Duration of Discharge  Encounter   Greater than 30 minutes including physician time.  Signed, Kriste Basque Ellissa Ayo PA-C 11/08/2015, 11:04 AM

## 2015-11-10 ENCOUNTER — Encounter: Payer: Self-pay | Admitting: Cardiovascular Disease

## 2015-11-10 ENCOUNTER — Telehealth: Payer: Self-pay | Admitting: Cardiovascular Disease

## 2015-11-10 DIAGNOSIS — R0600 Dyspnea, unspecified: Secondary | ICD-10-CM

## 2015-11-10 NOTE — Telephone Encounter (Signed)
During TOC call patient c/o not be able to lay down without feeling short of breath and having discomfort in chest Weight was 200 lbs on Monday and today 203 lbs Had same type of symptoms in the hospital improved with Lasix Patient is going to increase his lasix from 20 mg a day to Lasix 20 mg twice a day for three days Follow up post dc appointment has been moved up to December 29th with Rhonda Barrett at 0900 Instructed to call if increasing Lasix dose is not improving symptoms Understands that there is someone on call to answer ay concerns or provide medical direction as needed  Routed to Dr. Allyson SabalBerry

## 2015-11-10 NOTE — Telephone Encounter (Signed)
Pt's wife called in stating that the pt had 2 more episodes of chest pain and so he took 2 NTG. He gets headaches from taking the Nitro and while he was in the hospital he was given a Tylenol but he was not sure of the strength. Please f/u with her  Thanks

## 2015-11-10 NOTE — Telephone Encounter (Signed)
Patient contacted regarding discharge from [facility] on 11/08/2015.  Patient understands to follow up with provider Theodore Demarkhonda Barrett on 11/12/2015 at 09:00 am [facility]. Patient understands discharge instructions? Yes Patient understands medications and regiment? Yes Patient understands to bring all medications to this visit? Yes  Pt stated he's having a lot of trouble with pulmonary edema, stated he can not lay down it painful can't breath. Call transfer to to triage nurse, pt lasix was increase to an extra 20 mg daily x 3 days, appt was changed

## 2015-11-10 NOTE — Telephone Encounter (Signed)
TCM phone call . appt is on 11/19/15 at 2:30pm w/ Wilburt FinlayBryan Hager at  [facility]   Thanks

## 2015-11-10 NOTE — Telephone Encounter (Signed)
Wife instructed to take as directed on tylenol or acetaminophen label If it is regular strength take two 325 mg tablets If she has extra stength take two 500 mg tablets

## 2015-11-11 ENCOUNTER — Ambulatory Visit (INDEPENDENT_AMBULATORY_CARE_PROVIDER_SITE_OTHER): Payer: BLUE CROSS/BLUE SHIELD | Admitting: Cardiology

## 2015-11-11 ENCOUNTER — Other Ambulatory Visit (HOSPITAL_COMMUNITY): Payer: BLUE CROSS/BLUE SHIELD

## 2015-11-11 ENCOUNTER — Encounter: Payer: Self-pay | Admitting: Cardiology

## 2015-11-11 ENCOUNTER — Ambulatory Visit (HOSPITAL_COMMUNITY)
Admission: RE | Admit: 2015-11-11 | Discharge: 2015-11-11 | Disposition: A | Discharge status: Home / Self Care | Payer: BLUE CROSS/BLUE SHIELD | Source: Ambulatory Visit | Attending: Cardiology | Admitting: Cardiology

## 2015-11-11 VITALS — BP 92/60 | HR 80 | Ht 70.0 in | Wt 202.0 lb

## 2015-11-11 DIAGNOSIS — I241 Dressler's syndrome: Secondary | ICD-10-CM | POA: Diagnosis not present

## 2015-11-11 DIAGNOSIS — R0600 Dyspnea, unspecified: Secondary | ICD-10-CM | POA: Diagnosis not present

## 2015-11-11 DIAGNOSIS — R0602 Shortness of breath: Secondary | ICD-10-CM | POA: Diagnosis not present

## 2015-11-11 DIAGNOSIS — I255 Ischemic cardiomyopathy: Secondary | ICD-10-CM

## 2015-11-11 DIAGNOSIS — E785 Hyperlipidemia, unspecified: Secondary | ICD-10-CM | POA: Diagnosis not present

## 2015-11-11 DIAGNOSIS — I340 Nonrheumatic mitral (valve) insufficiency: Secondary | ICD-10-CM | POA: Diagnosis not present

## 2015-11-11 DIAGNOSIS — I313 Pericardial effusion (noninflammatory): Secondary | ICD-10-CM | POA: Diagnosis not present

## 2015-11-11 DIAGNOSIS — I2510 Atherosclerotic heart disease of native coronary artery without angina pectoris: Secondary | ICD-10-CM

## 2015-11-11 DIAGNOSIS — I5041 Acute combined systolic (congestive) and diastolic (congestive) heart failure: Secondary | ICD-10-CM

## 2015-11-11 DIAGNOSIS — Z9861 Coronary angioplasty status: Secondary | ICD-10-CM

## 2015-11-11 DIAGNOSIS — F17200 Nicotine dependence, unspecified, uncomplicated: Secondary | ICD-10-CM | POA: Insufficient documentation

## 2015-11-11 DIAGNOSIS — I472 Ventricular tachycardia, unspecified: Secondary | ICD-10-CM

## 2015-11-11 DIAGNOSIS — R29898 Other symptoms and signs involving the musculoskeletal system: Secondary | ICD-10-CM | POA: Diagnosis not present

## 2015-11-11 DIAGNOSIS — I2102 ST elevation (STEMI) myocardial infarction involving left anterior descending coronary artery: Secondary | ICD-10-CM | POA: Diagnosis not present

## 2015-11-11 MED ORDER — PREDNISONE 10 MG PO TABS: ORAL_TABLET | ORAL | Status: DC

## 2015-11-11 MED ORDER — ISOSORBIDE MONONITRATE ER 30 MG PO TB24: 30.0000 mg | ORAL_TABLET | Freq: Every day | ORAL | Status: DC

## 2015-11-11 MED ORDER — FUROSEMIDE 20 MG PO TABS: 40.0000 mg | ORAL_TABLET | Freq: Every day | ORAL | Status: DC

## 2015-11-11 MED ORDER — OXYCODONE-ACETAMINOPHEN 5-325 MG PO TABS: 1.0000 | ORAL_TABLET | Freq: Three times a day (TID) | ORAL | Status: DC | PRN

## 2015-11-11 NOTE — Assessment & Plan Note (Signed)
Recent anterior MI with LAD stent. I don't think this intermittent symptom is indeed related to his LAD stent, however he does have residual RCA disease. If he continues to have symptoms, especially exertional chest discomfort and dyspnea, and we may need to consider relook angiography to ensure that there are no issues with LAD stent and potentially consider FFR of the RCA lesion.  Continue Effient. While on ibuprofen, hold aspirin.  Since he does have some relief from nitroglycerin, I will add him to 30 mg daily.  He does not have adequate blood pressure room to consider increasing his beta blocker dose. Continue 25 mg twice a day.  High-dose statin

## 2015-11-11 NOTE — Patient Instructions (Signed)
Start isosorbide (imdur) 30 mg one tablet daily  May use percocet 1-2 tablet every 8 hours as need for  Chest discomfort pain for up to 3 weeks   Take the prednisone as directed on the bottle.  Please call the office Friday if not feeling better.  Take lasix 40 in the morning (2 tablet in the morning) may take an extra tablet in the afternoon if needed.  Your physician recommends that you schedule a follow-up appointment in next week with an extender.

## 2015-11-11 NOTE — Addendum Note (Signed)
Addended by: Lake BellsEAGUE, Bobbijo Holst J on: 11/11/2015 08:47 AM   Modules accepted: Orders

## 2015-11-11 NOTE — Telephone Encounter (Signed)
Patient had echo. Still has quite a bit of pain when he tries to lay flat.  When he tries to ly flat feels pressure in his chest and feeling he can't breathe Echo results to Dr. Herbie BaltimoreHarding with EKG done this morning Placed in open 1115am slot with Dr. Herbie BaltimoreHarding for evaluation

## 2015-11-11 NOTE — Assessment & Plan Note (Signed)
I can tell this is related completely to heart failure versus also related to MR or the pericardial effusion. There was no sign of tamponade, therefore less likely related to effusive/constrictive pericarditis. Hopefully with diuresis his dyspnea will improve.

## 2015-11-11 NOTE — Assessment & Plan Note (Addendum)
Post infarct symptoms it sounded like pericarditis. Now with echocardiographic evidence of pericardial effusion which would corroborate that diagnosis. I am somewhat using ibuprofen in the setting of new stent with Effient. While on ibuprofen, I recommended that he hold aspirin.  Plan: Continue current ibuprofen course. Consider adding seen if no improvement. I will add prednisone taper: 60 mg x3 days, 40 mg x3 days, 20 mg x2 days, 10 mg x3 days then stop. Since he does have significant pain and is having difficulty sleeping, I will provide when necessary Percocet.

## 2015-11-11 NOTE — Assessment & Plan Note (Signed)
No further episodes of VT or arrhythmia symptoms. He doesn't produce significant residual scar from where he was shocked by the defibrillator pads. Recommended using topical hydrocortisone cream

## 2015-11-11 NOTE — Progress Notes (Signed)
PATIENT: Roger [name] MRN: [id number] DOB: 02/03/1951 PCP: Maryelizabeth Rowan, MD  Clinic Note: Chief Complaint  Patient presents with  . Coronary Artery Disease    Recent Anterior STEMI (12/21) - PCI to LAD  . Chest Pain    treated for possible Dressler's Periarditis on d/c  . Shortness of Breath    DOE & Orthopnea.    HPI: Roger [name] is a 64 y.o. male with a PMH below who presents today for urgent walki-in evaluation for chest pain & orthopnea ~ 1 week s/p Anterior MI with PCI of LAD complicated by VT arrest x 2 & possible post-MI Dressler's.  [name] was admitted with anterior stenting on November 04, 2015.  His presenting symptom was sharp central chest pain radiating to his back associated with diaphoresis and nausea.. He had an occluded LAD treated with DES stent as described below. During his catheterization he did suffer a sustained ventricular tachycardia x2 with defibrillation. He developed acute combined systolic and diastolic heart failure with mild ischemic cardiomyopathy. Also post MI, he continued to have relatively atypical chest pain and dyspnea as well as elevated troponin. Noted that her cough and dyspnea treated with nebulizers. He been recovering from a URI. He had gentle diuresis with IV Lasix transitioned to oral Lasix prior to discharge. Brilinta was converted to Effient due to concern for possible relatively dyspnea. Finally he was felt to potentially have  Pericarditic pain from Dressler syndrome, and was started on ibuprofen prior to discharge. His wife called in on the 27th noting 2 more episodes of chest discomfort requiring nitroglycerin. Dr. Coralee Pesa recommended that he have a repeat echocardiogram which he now presents for.hhe really has gained at least 20 pounds since discharge and was recommended that he increase his Lasix.the original plan was for him to be seen in clinic on Friday, December 30th or Thursday, December 29, however as he presented  today, he was seen as a work in patient after being discussed with the triage nurse. -- followup echocardiogram now shows pericardial effusion as well as worsening MR.  STUDIES REVIEWED:  Cath/PCI: 12/21/'16 - films & report reviewed) Coronary Diagrams    Diagnostic Diagram                                              Post-Intervention Diagram              Conclusion    1. Mid LAD lesion, 100% stenosed. PCI with Promus Premier DES 2.75 mm x 16 mm (3.3 mm).  Post intervention, there is a 0% residual stenosis. 2. Complicated by VT arrest x 2 - s/p Defibrillation with ROSC 3. Mid RCA lesion, 50% stenosed (ulcerated, irregular); Mid RCA to Dist RCA lesion, 40% stenosed. 4. Prox LAD to Mid LAD lesion, 50% stenosed.    2D Echo 11/05/15: mild LVH. Mildly reduced EF -- ~ 45-50%. Mid-apical anterooseptal hypokinesis. Grade 1 DD. High LVEDP. Trace MR. No effusion.  2D Echo 11/11/15: (personally reviewed): EF 40-45%, mid-apical Anteroseptal, anterior & apical Akinesis; Mod MR, small to moderate posterior Periardial Effusion.  Interval History: basically, the patient notes that ever since his discharge from the hospital he's had persistent sharp shooting pain full of substernal chest discomfort. It is definitely worse with lying down, and is also exacerbated by exertion. He also notes significant orthopnea, without edema. He also has  exertional dyspnea. He says that his discomfort is relieved somewhat with nitroglycerin, however this gives him a headache. It is also somewhat relieved with sitting forward and leaning on a pillow or chair to keep himself upright. He is not a known sleep very well has been under extreme amount of pain. He does not necessarily describe his symptoms being the exact type symptoms he had presenting for his MI. He is however not very clear recollection of that symptom. He has not had any previous symptoms of rapid/irregular heartbeats. No syncope or near syncope. Does not  note any paroxysmal himself dyspneic, but simply notes PND, orthopnea and exertional dyspnea. No syncope/near syncope , TIA or amaurosis fugax. Essentially he is very worn out from not being able to sleep - basic needs to remain sitting straight upright in order to avoid exacerbating the pain.  He denies any melena, hematochezia or hematuria. He denies any fevers, chills or worsening cough. He does note that the chest discomfort is worse with cough. He is not having productive cough.  Past Medical History  Diagnosis Date  . ST elevation (STEMI) myocardial infarction involving left anterior descending coronary artery (HCC) 11/04/2015    LAD 100%,   . CAD S/P percutaneous coronary angioplasty 11/04/2015    a. Ant STEMI 10/2015 - s/p DES to LAD , (Promus Premier DES 2.75 mm x 16 mm; 3.3 mm), residual ~50+% ulcerated RCA disease being treated medically.   Marland Kitchen CAD (coronary artery disease)     a. Ant STEMI 10/2015 - s/p DES to LAD, residual RCA disease being treated medically.   . Sustained VT (ventricular tachycardia) (HCC)     a. Ischemic -- Had VT-VF during Cath for Anterior STEMI - Defib x 2  . Ischemic cardiomyopathy     a. 12/22 Echo: Post anterior STEMI EF 45-50%, followup echo 11/11/15 EF 40-45%.  . Chronic combined systolic and diastolic CHF (congestive heart failure) (HCC)     a. 2D echo 10/2015 at time of MI: EF 45-50%, grade 1 DD.  Marland Kitchen Hyperglycemia     a. 10/2015 - A1C 6.5.  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 Tobacco abuse   . Hyperlipidemia with target LDL less than 70   . Kidney calculus   . Colon polyp     Prior Cardiac Evaluation and Past Surgical History: Past Surgical History  Procedure Laterality Date  . Hernia repair      left inguinal  . Vasectomy    . Cardiac catheterization N/A 11/04/2015    Procedure: Left Heart Cath and Coronary Angiography;  Surgeon: Runell Gess, MD;  Location: [facility] INVASIVE CV LAB;  Service: Cardiovascular;   mLAD 50% then 100%, mRCA 50% ulcerted -- PCI LAD  . Cardiac catheterization N/A 11/04/2015    Procedure: Coronary Stent Intervention;  Surgeon: Runell Gess, MD;  Location: [facility] INVASIVE CV LAB;  Service: Cardiovascular;  DES PCI to mLAD - Promus Premier 2.75 mm x 16 mm (3.3.mm)  . Transthoracic echocardiogram  12/22 & 12/28 2016    a. Mild LVH, EF 45-50. Mid-apical-anteroseptal hypokinesis, grade 1 DD with high LVEDP and trace MR.; b. EF 40-45%, mid apical anteroseptal, anterior, apical akinesis. Moderate MR, small-moderate posterior pericardial effusion.    No Known Allergies  Current Outpatient Prescriptions  Medication Sig Dispense Refill  . aspirin EC 81 MG EC tablet Take 1 tablet (81 mg total) by mouth daily. 30 tablet 11  . atorvastatin (LIPITOR) 80 MG  tablet Take 1 tablet (80 mg total) by mouth every evening. 30 tablet 6  . azithromycin (ZITHROMAX) 250 MG tablet Take 1 tablet (250 mg total) by mouth daily. Will finish course on 11/10/15 2 each 0  . Cholecalciferol (VITAMIN D3) 1000 UNITS CAPS Take 1,000 Units by mouth daily as needed (takes occasionally).    . furosemide (LASIX) 20 MG tablet Take 2 tablets (40 mg total) by mouth daily. May take extra dose if needed 75 tablet 6  . ibuprofen (ADVIL,MOTRIN) 600 MG tablet Take 1 tablet (600 mg total) by mouth 2 (two) times daily. 20 tablet 0  . metoprolol tartrate (LOPRESSOR) 25 MG tablet Take 1 tablet (25 mg total) by mouth 2 (two) times daily. 60 tablet 6  . nitroGLYCERIN (NITROSTAT) 0.4 MG SL tablet Place 1 tablet (0.4 mg total) under the tongue every 5 (five) minutes as needed for chest pain (up to 3 doses). 25 tablet 3  . pantoprazole (PROTONIX) 40 MG tablet Take 1 tablet (40 mg total) by mouth daily. While taking ibuprofen. 30 tablet 0  . potassium chloride (K-DUR,KLOR-CON) 10 MEQ tablet Take 1 tablet (10 mEq total) by mouth daily. 30 tablet 6  . prasugrel (EFFIENT) 10 MG TABS tablet Take 1 tablet (10 mg total) by mouth daily. 30  tablet 11  . isosorbide mononitrate (IMDUR) 30 MG 24 hr tablet Take 1 tablet (30 mg total) by mouth daily. 30 tablet 3  . oxyCODONE-acetaminophen (PERCOCET/ROXICET) 5-325 MG tablet Take 1-2 tablets by mouth every 8 (eight) hours as needed for severe pain. 126 tablet 0  . predniSONE (DELTASONE) 10 MG tablet Take as directed --60 mg for 3 days,40 mg for 3 days, 20 mg for 3 days,10 mg for 3 days then stop 39 tablet 0   No current facility-administered medications for this visit.   Social History   Social History  . Marital Status: Married    Spouse Name: N/A  . Number of Children: N/A  . Years of Education: N/A   Occupational History  . Not on file.   Social History Main Topics  . Smoking status: Current Every Day Smoker  . Smokeless tobacco: Never Used  . Alcohol Use: Yes  . Drug Use: No  . Sexual Activity: Not on file     Comment: married   Other Topics Concern  . Not on file   Social History Narrative    family history includes Aneurysm in his mother; Dementia in his father; Dystonia in his sister.  ROS: A comprehensive Review of Systems - was performed Review of Systems  Constitutional: Positive for malaise/fatigue. Negative for fever and chills.  HENT: Negative for nosebleeds.   Respiratory: Positive for cough and shortness of breath. Negative for sputum production.   Cardiovascular: Positive for chest pain, orthopnea and PND. Negative for claudication and leg swelling.  Gastrointestinal: Negative for blood in stool and melena.  Genitourinary: Negative for hematuria.  Musculoskeletal: Negative for falls.       Chest wall pain  Skin:       Scar from defibrillator pad on anterior chest  Neurological: Positive for dizziness (Positional) and weakness. Negative for speech change, focal weakness and seizures.  Endo/Heme/Allergies: Does not bruise/bleed easily.  Psychiatric/Behavioral: The patient is nervous/anxious.   All other systems reviewed and are  negative.   PHYSICAL EXAM BP 92/60 mmHg  Pulse 80  Ht 5\' 10"  (1.778 m)  Wt 202 lb (91.627 kg)  BMI 28.98 kg/m2 General appearance: alert, cooperative, appears stated age,  mild distress and Somewhat tired appearing. No acute distress just uncomfortable HEENT: [place]/AT, EOMI, MMM, anicteric sclera Neck: no adenopathy, no carotid bruit, no JVD and supple, symmetrical, trachea midline Lungs: clear to auscultation bilaterally, normal percussion bilaterally and Nonlabored, good air movement Heart: RRR with occasional ectopy. 1-2/6 HSM at LLSB-apex, no obvious R./G. Nondisplaced PMI. Abdomen: soft, non-tender; bowel sounds normal; no masses,  no organomegaly Extremities: extremities normal, atraumatic, no cyanosis or edema Pulses: 2+ and symmetric Neurologic: Alert and oriented X 3, normal strength and tone. Normal symmetric reflexes. Normal coordination and gait   Adult ECG Report  Rate: 80 ;  Rhythm: normal sinus rhythm  QRS Axis: 31 ;  PR Interval: 190 ;  QRS Duration: 80 ; QTc: 422;  Voltages: borderline low, notably in limb leads.  Conduction Disturbances: none;  Other Abnormalities: persistent anterior/septal ST elevations in V2-V5 somewhat consistent with post MI EKG   Narrative Interpretation: relatively stable EKG with delayed progression of anterior ST elevation.  Recent Labs: Lab Results  Component Value Date   CREATININE 1.16 11/08/2015    ASSESSMENT / PLAN: This is a very complicated situation with a gentleman who is now one week post relatively successful PCI of the LAD during anterior MI. I do think his symptoms sound somewhat atypical for classic anginal symptoms, but they are described as exertional with exertional dyspnea and relief from nitroglycerin. The symptoms are however also positional I may work better by sitting up and worse with lying down which would be more consistent with pericarditis pain.  He does have existing RCA disease, but I don't think that look  significant enough to cause him to have anginal pain -- perhaps maybe with significant exertion, not with routine activity. He has persistent anteroseptal lesions which potentially could suggest mild aneurysmal dilation that was not seen on echocardiogram. This could be also related to pericarditis.  Problem List Items Addressed This Visit    Sustained ventricular tachycardia (HCC) - during anterior MI    No further episodes of VT or arrhythmia symptoms. He doesn't produce significant residual scar from where he was shocked by the defibrillator pads. Recommended using topical hydrocortisone cream      Relevant Medications   isosorbide mononitrate (IMDUR) 30 MG 24 hr tablet   furosemide (LASIX) 20 MG tablet   ST elevation (STEMI) myocardial infarction involving left anterior descending coronary artery (HCC)   Relevant Medications   isosorbide mononitrate (IMDUR) 30 MG 24 hr tablet   furosemide (LASIX) 20 MG tablet   Other Relevant Orders   EKG 12-Lead   Shortness of breath    I can tell this is related completely to heart failure versus also related to MR or the pericardial effusion. There was no sign of tamponade, therefore less likely related to effusive/constrictive pericarditis. Hopefully with diuresis his dyspnea will improve.      Relevant Orders   EKG 12-Lead   Dressler's syndrome (HCC) (Chronic)    Post infarct symptoms it sounded like pericarditis. Now with echocardiographic evidence of pericardial effusion which would corroborate that diagnosis. I am somewhat using ibuprofen in the setting of new stent with Effient. While on ibuprofen, I recommended that he hold aspirin.  Plan: Continue current ibuprofen course. Consider adding seen if no improvement. I will add prednisone taper: 60 mg x3 days, 40 mg x3 days, 20 mg x2 days, 10 mg x3 days then stop. Since he does have significant pain and is having difficulty sleeping, I will provide when necessary Percocet.  Relevant  Medications   isosorbide mononitrate (IMDUR) 30 MG 24 hr tablet   furosemide (LASIX) 20 MG tablet   Cardiomyopathy, ischemic   Relevant Medications   isosorbide mononitrate (IMDUR) 30 MG 24 hr tablet   furosemide (LASIX) 20 MG tablet   CAD S/P percutaneous coronary angioplasty - Primary (Chronic)    Recent anterior MI with LAD stent. I don't think this intermittent symptom is indeed related to his LAD stent, however he does have residual RCA disease. If he continues to have symptoms, especially exertional chest discomfort and dyspnea, and we may need to consider relook angiography to ensure that there are no issues with LAD stent and potentially consider FFR of the RCA lesion.  Continue Effient. While on ibuprofen, hold aspirin.  Since he does have some relief from nitroglycerin, I will add him to 30 mg daily.  He does not have adequate blood pressure room to consider increasing his beta blocker dose. Continue 25 mg twice a day.  High-dose statin       Relevant Medications   isosorbide mononitrate (IMDUR) 30 MG 24 hr tablet   furosemide (LASIX) 20 MG tablet   Other Relevant Orders   EKG 12-Lead   Acute combined systolic and diastolic heart failure (HCC)    Coralee Rud has exertional dyspnea and orthopnea which would still be related to a post MI he diastolic dysfunction with elevated LVEDP and mild to moderately reduced LV function. With borderline hypotension, I cannot increase his beta blocker dose. Would prefer to convert from  Metoprolol tartrate to succinate, but we will wait to see what is blood pressure normalizes out to be. Increase standing dose of Lasix to 40 mg the morning for now. Likely at least for 3 days and then reduced back to 20 mg daily with additional 20 when necessary.       Relevant Medications   isosorbide mononitrate (IMDUR) 30 MG 24 hr tablet   furosemide (LASIX) 20 MG tablet   Other Relevant Orders   EKG 12-Lead      Meds ordered this encounter   Medications  . isosorbide mononitrate (IMDUR) 30 MG 24 hr tablet    Sig: Take 1 tablet (30 mg total) by mouth daily.    Dispense:  30 tablet    Refill:  3  . oxyCODONE-acetaminophen (PERCOCET/ROXICET) 5-325 MG tablet    Sig: Take 1-2 tablets by mouth every 8 (eight) hours as needed for severe pain.    Dispense:  126 tablet    Refill:  0  . predniSONE (DELTASONE) 10 MG tablet    Sig: Take as directed --60 mg for 3 days,40 mg for 3 days, 20 mg for 3 days,10 mg for 3 days then stop    Dispense:  39 tablet    Refill:  0  . furosemide (LASIX) 20 MG tablet    Sig: Take 2 tablets (40 mg total) by mouth daily. May take extra dose if needed    Dispense:  75 tablet    Refill:  6    Patient instructions:  Start isosorbide (imdur) 30 mg one tablet daily  May use percocet 1-2 tablet every 8 hours as need for  Chest discomfort pain for up to 3 weeks  Take the prednisone as directed on the bottle.  Please call the office Friday if not feeling better.  Take lasix 40 in the morning (2 tablet in the morning) may take an extra tablet in the afternoon if needed.  Followup: postpone planned PA visit  for this week the next week. Continue with planned followup with Dr. Allyson Sabal later this month.  I did discuss the plan with Dr. Lavonia Dana, Philena Obey Lacretia Nicks, M.D., M.S. Interventional Cardiologist   Pager # [phone]

## 2015-11-11 NOTE — Telephone Encounter (Signed)
Talked with wife this morning Lasix did not help last night Says he is having chest and back pain relieved by nitro Will be here at 10am this morning for echo Has an appointment tomorrow (12/29) with Theodore Demarkhonda Barrett; will try to move up to today if possible

## 2015-11-11 NOTE — Assessment & Plan Note (Signed)
Roger [name] has exertional dyspnea and orthopnea which would still be related to a post MI he diastolic dysfunction with elevated LVEDP and mild to moderately reduced LV function. With borderline hypotension, I cannot increase his beta blocker dose. Would prefer to convert from  Metoprolol tartrate to succinate, but we will wait to see what is blood pressure normalizes out to be. Increase standing dose of Lasix to 40 mg the morning for now. Likely at least for 3 days and then reduced back to 20 mg daily with additional 20 when necessary.

## 2015-11-11 NOTE — Telephone Encounter (Signed)
Needs to have a 2D echo tomorrow and be seen in [facility]LEX clinic on Friday prior to the long holiday weekend

## 2015-11-12 ENCOUNTER — Telehealth: Payer: Self-pay | Admitting: Cardiovascular Disease

## 2015-11-12 ENCOUNTER — Ambulatory Visit: Payer: BLUE CROSS/BLUE SHIELD | Admitting: Physician Assistant

## 2015-11-12 NOTE — Telephone Encounter (Signed)
Pt stated he was finally able to sleep last night and was very thankful Pt stated he is worried about constipation with the pain medications.  Told him that when drinking fluids try to drink water, being mindful not to drink to much.  Pt stated that his wight is down from yesterday and he is monitoring closely. Pt also encouraged to get a stool softeners from pharmacy to help.  Pt also noticed that since lasix was increased did he need to increase his potassium dose. Instructed pt that he is on the appropriate amount of potassium at this time and he does not need to make any adjustments to that medication.  Pt verbalized understanding and no additional questions at this time.

## 2015-11-12 NOTE — Telephone Encounter (Signed)
Pt saw Dr Herbie BaltimoreHarding yesterday,he would like to talk to you about his medicine.

## 2015-11-13 ENCOUNTER — Telehealth: Payer: Self-pay | Admitting: Cardiovascular Disease

## 2015-11-13 NOTE — Telephone Encounter (Signed)
Pt's wife is calling back to speak with a nurse.  Thanks

## 2015-11-13 NOTE — Telephone Encounter (Signed)
Please call,pt had a bad day yesterday. She asked for you.

## 2015-11-13 NOTE — Telephone Encounter (Signed)
Pt called to update us on his symptoms. Pt stated that he had been feeling better but yesterday he had a rough day and every time he moved he experienced chest pain.  With each episode he would wait about 5-10 minutes and then take a SL Nito.  He stated that over the course of the day he took about 10-12 doses of the medication.  Pt was educated that he is not to take more than3 doses in a 24 hour period.  Pt also reminded that he is taking a lot of medications and he needs to give himself time, longer than 10 minutes of rest after moving around. Pt told that he is going to be in some pain with movement ad those he has pain medication it will help but probably not take it all away.  Pt reminded to stay ahead of his pain and take medication on a schedule to keep it from getting so bad. Pt reminded of upcoming appt next week and to call 911 next time he needs to take that much Nitro. Pt verbalized understanding and no additional questions at this time.

## 2015-11-17 ENCOUNTER — Telehealth: Payer: Self-pay | Admitting: Cardiovascular Disease

## 2015-11-17 MED ORDER — FUROSEMIDE 20 MG PO TABS: 40.0000 mg | ORAL_TABLET | Freq: Every day | ORAL | Status: DC

## 2015-11-17 NOTE — Telephone Encounter (Signed)
Spoke with pt, he cont to have SOB, he states he is in the yellow zone. He has gained about 1 lb daily and is up a total of 9 lbs since 12/26. He has edema in his feet and ankles and his abdomen feels tight. He is having trouble lying flat. He has a follow up appt on Thursday this week. Pt instructed to take extra 40 mg of furosemide daily x the next 3 days = 40 mg bid. New script sent to the pharmacy. Will make dr berry aware

## 2015-11-17 NOTE — Telephone Encounter (Signed)
Please call,needs to give you an update of his condition.

## 2015-11-18 MED ORDER — NITROGLYCERIN 0.4 MG SL SUBL: 0.4000 mg | SUBLINGUAL_TABLET | SUBLINGUAL | Status: DC | PRN

## 2015-11-18 MED ORDER — FUROSEMIDE 20 MG PO TABS
40.0000 mg | ORAL_TABLET | Freq: Every day | ORAL | Status: DC
Start: 2015-11-18 — End: 2015-11-19

## 2015-11-18 NOTE — Telephone Encounter (Signed)
Would you please resend his Furosemide refiill,it did not go through.

## 2015-11-18 NOTE — Telephone Encounter (Signed)
Refilled. Notified patient, he also requested Nitro refill, this was done as well. Advised to call if further needs, pt acknowledged.

## 2015-11-19 ENCOUNTER — Encounter: Payer: Self-pay | Admitting: Physician Assistant

## 2015-11-19 ENCOUNTER — Ambulatory Visit (INDEPENDENT_AMBULATORY_CARE_PROVIDER_SITE_OTHER): Payer: BLUE CROSS/BLUE SHIELD | Admitting: Physician Assistant

## 2015-11-19 VITALS — BP 108/64 | HR 91 | Ht 71.0 in | Wt 211.0 lb

## 2015-11-19 DIAGNOSIS — I241 Dressler's syndrome: Secondary | ICD-10-CM

## 2015-11-19 DIAGNOSIS — I2510 Atherosclerotic heart disease of native coronary artery without angina pectoris: Secondary | ICD-10-CM | POA: Diagnosis not present

## 2015-11-19 DIAGNOSIS — E785 Hyperlipidemia, unspecified: Secondary | ICD-10-CM

## 2015-11-19 DIAGNOSIS — Z9861 Coronary angioplasty status: Secondary | ICD-10-CM

## 2015-11-19 DIAGNOSIS — I255 Ischemic cardiomyopathy: Secondary | ICD-10-CM

## 2015-11-19 DIAGNOSIS — Z79899 Other long term (current) drug therapy: Secondary | ICD-10-CM

## 2015-11-19 DIAGNOSIS — I5041 Acute combined systolic (congestive) and diastolic (congestive) heart failure: Secondary | ICD-10-CM

## 2015-11-19 MED ORDER — FUROSEMIDE 20 MG PO TABS: 40.0000 mg | ORAL_TABLET | Freq: Every day | ORAL | Status: DC

## 2015-11-19 MED ORDER — IBUPROFEN 200 MG PO TABS: 200.0000 mg | ORAL_TABLET | Freq: Three times a day (TID) | ORAL | Status: DC | PRN

## 2015-11-19 MED ORDER — FUROSEMIDE 20 MG PO TABS: 20.0000 mg | ORAL_TABLET | Freq: Every day | ORAL | Status: DC

## 2015-11-19 NOTE — Patient Instructions (Addendum)
Your physician has recommended you make the following change in your medication:   DECREASE FUROSEMIDE TO 40 MG - TAKE TWO TABLET DAILY  DECREASE IBUPROFEN TO 200 MG - TAKE AS NEEDED  Your physician recommends that you return for lab work in:   Monday OR Tuesday AT [facility]   ([address])  5 WEEKS HAVE FASTING BLOOD WORK AT [facility]   Your physician recommends that you weigh, daily, at the same time every day, and in the same amount of clothing. Please record your daily weights on the handout provided and bring it to your next appointment.   IF YOUR WEIGHT INCREASES 3 LBS OVER NIGHT OR 5 LBS IN ONE WEEKS PLEASE CALL OUR OFFICE FOR ADDITIONAL INSTRUCTIONS - [phone]  Your physician recommends that you schedule a follow-up appointment in: KEEP YOUR FOLLOW-UP APPOINTMENT WITH DR. Allyson SabalBERRY 12/01/15 AT 2:30PM.  Daily Weight Record It is important to weigh yourself daily. Keep this daily weight chart near your scale. Weigh yourself each morning at the same time. Weigh yourself without shoes, and wear the same amount of clothing each day. Compare today's weight to yesterday's weight. Bring this form with you to your follow-up appointments. Call your health care provider if you have concerns about your weight, including rapid weight gain or rapid weight loss. Date: ________ Weight: ____________________ Date: ________ Weight: ____________________ Date: ________ Weight: ____________________ Date: ________ Weight: ____________________ Date: ________ Weight: ____________________ Date: ________ Weight: ____________________ Date: ________ Weight: ____________________ Date: ________ Weight: ____________________ Date: ________ Weight: ____________________ Date: ________ Weight: ____________________ Date: ________ Weight: ____________________ Date: ________ Weight: ____________________ Date: ________ Weight: ____________________ Date: ________ Weight: ____________________ Date: ________  Weight: ____________________ Date: ________ Weight: ____________________ Date: ________ Weight: ____________________ Date: ________ Weight: ____________________ Date: ________ Weight: ____________________ Date: ________ Weight: ____________________ Date: ________ Weight: ____________________ Date: ________ Weight: ____________________ Date: ________ Weight: ____________________ Date: ________ Weight: ____________________ Date: ________ Weight: ____________________ Date: ________ Weight: ____________________ Date: ________ Weight: ____________________ Date: ________ Weight: ____________________ Date: ________ Weight: ____________________ Date: ________ Weight: ____________________ Date: ________ Weight: ____________________ Date: ________ Weight: ____________________ Date: ________ Weight: ____________________ Date: ________ Weight: ____________________ Date: ________ Weight: ____________________ Date: ________ Weight: ____________________ Date: ________ Weight: ____________________ Date: ________ Weight: ____________________ Date: ________ Weight: ____________________ Date: ________ Weight: ____________________ Date: ________ Weight: ____________________ Date: ________ Weight: ____________________ Date: ________ Weight: ____________________ Date: ________ Weight: ____________________ Date: ________ Weight: ____________________ Date: ________ Weight: ____________________ Date: ________ Weight: ____________________ Date: ________ Weight: ____________________ Date: ________ Weight: ____________________ Date: ________ Weight: ____________________   This information is not intended to replace advice given to you by your health care provider. Make sure you discuss any questions you have with your health care provider.   Document Released: 01/12/2007 Document Revised: 11/21/2014 Document Reviewed: 05/30/2014 Elsevier Interactive Patient Education [employer]2016 [employer] Inc.

## 2015-11-19 NOTE — Progress Notes (Signed)
Patient ID: Roger [name], male   DOB: 1951/03/09, 66 y.o.   MRN: [id number]    Date:  11/19/2015   ID:  Roger [name], DOB 19-Apr-1951, MRN [id number]  PCP:  Maryelizabeth Rowan, MD  Primary Cardiologist:   Allyson Sabal   Chief Complaint  Patient presents with  . Follow-up    no complaints.     History of Present Illness: Roger [name] is a 65 y.o. male with history of dyslipidemia (patient had stopped statin due to memory issues) and tobacco abuse who presented to [facility] with chest pain and NSTEMI. He had been having chest pain for a week off and on.While at work his symptoms worsened with sharp pain into his back associated with severe diaphoresis and nausea.  EKG showed 4-73mm of ST elevation in V1-V3 and I and aVL with marked ST depression in the inferior leads.He had NSVT on the monitor on the way to the cath lab - was taken emergently for coronary angiography. LHC showed occluded proximal LAD with residual 50-60% hypodense ulcer ulcerated mid dominant RCA stenosis that did not appear to be flow-limiting. LAD was treated with PTCA/DES. He fibrillated at after stent deployment requiring defibrillation twice. CPR was not performed. The patient was alert and awake at the end of the case. His RCA will be treated medically with consideration for outpatient Myoview once he recovers from MI. He was started on standard post-MI therapy. Post-cath he continued to have some CP, SOB, & elevated troponin - he was restarted on heparin for a period of time. He also reported cough/diaphoresis and was started on nebulizers. (Of note the patient reported he had been recovering from a cold/URI for the past 2 weeks. He was started on a Zpack for presumed residual bronchitis.) He also developed orthopnea with pulm edema on CXR likely representing acute combined CHF and required short duration of IV Lasix -> transitioned to oral Lasix. 2D Echo 11/05/15: mild LVH, EF 45-50%, hypokinesis of the  mid-apicalanteroseptal and apical myocardium, high ventricular filling pressure, grade 1 DD, trace MR. Despite these measures he continued to have periodic SOB felt out of proportion to LV dysfunction. D-dimer was negative. Brilinta was changed to Effient with improvement in dyspnea.  He is not on ACEI/ARB due to softer BP. The patient did report positional and pleuritic CP felt to represent mild case of Dressler's syndrome. Per d/w Dr. Allyson Sabal, will start ibuprofen 600mg  BID. Advised patient take this with food  Patient presents for posthospital follow-up. He reports feeling much better today compared to yesterday. His chest pain is resolved. He was having issues with weight gain and fluid yesterday's Lasix was increased to 40 mg twice a day for 3 days. He started noticing improvement. According to his daily weight monitoring his weight has increased approximately 8 pounds.  Last night he was able to lay flat, but still has some lower extremity edema and increased abdominal girth.  The patient currently denies nausea, vomiting, fever, chest pain, shortness of breath, orthopnea, dizziness, PND, cough, congestion, abdominal pain, hematochezia, melena,  claudication.  Wt Readings from Last 3 Encounters:  11/19/15 211 lb (95.709 kg)  11/11/15 202 lb (91.627 kg)  11/08/15 205 lb (92.987 kg)     Past Medical History  Diagnosis Date  . ST elevation (STEMI) myocardial infarction involving left anterior descending coronary artery (HCC) 11/04/2015    LAD 100%,   . CAD S/P percutaneous coronary angioplasty 11/04/2015    a. Ant STEMI 10/2015 - s/p DES  to LAD , (Promus Premier DES 2.75 mm x 16 mm; 3.3 mm), residual ~50+% ulcerated RCA disease being treated medically.   Marland Kitchen CAD (coronary artery disease)     a. Ant STEMI 10/2015 - s/p DES to LAD, residual RCA disease being treated medically.   . Sustained VT (ventricular tachycardia) (HCC)     a. Ischemic -- Had VT-VF during Cath for Anterior STEMI - Defib x 2    . Ischemic cardiomyopathy     a. 12/22 Echo: Post anterior STEMI EF 45-50%, followup echo 11/11/15 EF 40-45%.  . Chronic combined systolic and diastolic CHF (congestive heart failure) (HCC)     a. 2D echo 10/2015 at time of MI: EF 45-50%, grade 1 DD.  Marland Kitchen Hyperglycemia     a. 10/2015 - A1C 6.5.  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 Tobacco abuse   . Hyperlipidemia with target LDL less than 70   . Kidney calculus   . Colon polyp     Current Outpatient Prescriptions  Medication Sig Dispense Refill  . aspirin EC 81 MG EC tablet Take 1 tablet (81 mg total) by mouth daily. 30 tablet 11  . atorvastatin (LIPITOR) 80 MG tablet Take 1 tablet (80 mg total) by mouth every evening. 30 tablet 6  . azithromycin (ZITHROMAX) 250 MG tablet Take 1 tablet (250 mg total) by mouth daily. Will finish course on 11/10/15 2 each 0  . Cholecalciferol (VITAMIN D3) 1000 UNITS CAPS Take 1,000 Units by mouth daily as needed (takes occasionally).    . furosemide (LASIX) 20 MG tablet Take 1 tablet (20 mg total) by mouth daily. Take extra dose if needed 90 tablet 2  . ibuprofen (ADVIL,MOTRIN) 200 MG tablet Take 1 tablet (200 mg total) by mouth every 8 (eight) hours as needed. 90 tablet 0  . isosorbide mononitrate (IMDUR) 30 MG 24 hr tablet Take 1 tablet (30 mg total) by mouth daily. 30 tablet 3  . metoprolol tartrate (LOPRESSOR) 25 MG tablet Take 1 tablet (25 mg total) by mouth 2 (two) times daily. 60 tablet 6  . nitroGLYCERIN (NITROSTAT) 0.4 MG SL tablet Place 1 tablet (0.4 mg total) under the tongue every 5 (five) minutes as needed for chest pain (up to 3 doses). 25 tablet 3  . oxyCODONE-acetaminophen (PERCOCET/ROXICET) 5-325 MG tablet Take 1-2 tablets by mouth every 8 (eight) hours as needed for severe pain. 126 tablet 0  . pantoprazole (PROTONIX) 40 MG tablet Take 1 tablet (40 mg total) by mouth daily. While taking ibuprofen. 30 tablet 0  . potassium chloride (K-DUR,KLOR-CON) 10 MEQ  tablet Take 1 tablet (10 mEq total) by mouth daily. 30 tablet 6  . prasugrel (EFFIENT) 10 MG TABS tablet Take 1 tablet (10 mg total) by mouth daily. 30 tablet 11  . predniSONE (DELTASONE) 10 MG tablet Take as directed --60 mg for 3 days,40 mg for 3 days, 20 mg for 3 days,10 mg for 3 days then stop 39 tablet 0   No current facility-administered medications for this visit.    Allergies:   No Known Allergies  Social History:  The patient  reports that he has been smoking.  He has never used smokeless tobacco. He reports that he drinks alcohol. He reports that he does not use illicit drugs.   Family history:   Family History  Problem Relation Age of Onset  . Aneurysm Mother   . Dementia Father   . Dystonia Sister  ROS:  Please see the history of present illness.  All other systems reviewed and negative.   PHYSICAL EXAM: VS:  BP 108/64 mmHg  Pulse 91  Ht 5\' 11"  (1.803 m)  Wt 211 lb (95.709 kg)  BMI 29.44 kg/m2 Over weight, well developed, in no acute distress HEENT: Pupils are equal round react to light accommodation extraocular movements are intact.  Neck: no JVDNo cervical lymphadenopathy. Cardiac: Regular rate and rhythm without murmurs rubs or gallops. Lungs:  clear to auscultation bilaterally, no wheezing, rhonchi or rales Abd: soft, nontender, positive bowel sounds all quadrants, no hepatosplenomegaly Ext: 1+ lower extremity edema.  2+ radial and dorsalis pedis pulses. Skin: warm and dry Neuro:  Grossly normal    ASSESSMENT AND PLAN:  Problem List Items Addressed This Visit    Hyperlipidemia - Primary   Relevant Medications   furosemide (LASIX) 20 MG tablet   Other Relevant Orders   Lipid panel   Dressler's syndrome (HCC) (Chronic)   Relevant Medications   furosemide (LASIX) 20 MG tablet   Cardiomyopathy, ischemic   Relevant Medications   furosemide (LASIX) 20 MG tablet   CAD S/P percutaneous coronary angioplasty (Chronic)   Relevant Medications   furosemide  (LASIX) 20 MG tablet   Acute combined systolic and diastolic heart failure (HCC)   Relevant Medications   furosemide (LASIX) 20 MG tablet    Other Visit Diagnoses    Medication management        Relevant Orders    Hepatic function panel    Basic metabolic panel       Patient appears to be doing well. He does have some signs of volume overload still. He has 2 more days of increased Lasix level decrease to 40 mg daily. We discussed continued weight monitoring, low sodium diet. We'll check a basic metabolic panel next Monday or Tuesday.  His chest pain  Resolved.  Changes ibuprofen 200 mg as needed.  He is on a statin and previously has had issues with memory loss per his wife, "acting out of his head".  Hopefully will not have to discontinue statin.Check LFTs and lipids in 5 weeks.  He should be contacted by cardiac rehabilitation approximately 2-3 weeks.  Additional medications include aspirin, Effient 10 mg  Imdur 30 mg.  Protonix 40 stopped.   Discharge summary indicated we should consider stress test once patient is recovered from his MI I suspect to assess right coronary artery. I'm not ordering that at this time.  He is due to follow up with Dr. Allyson SabalBerry January 17.

## 2015-11-24 ENCOUNTER — Other Ambulatory Visit: Payer: BLUE CROSS/BLUE SHIELD | Admitting: *Deleted

## 2015-11-24 DIAGNOSIS — Z79899 Other long term (current) drug therapy: Secondary | ICD-10-CM

## 2015-11-24 DIAGNOSIS — E785 Hyperlipidemia, unspecified: Secondary | ICD-10-CM

## 2015-11-24 LAB — HEPATIC FUNCTION PANEL
ALBUMIN: 3.6 g/dL (ref 3.6–5.1)
ALK PHOS: 87 U/L (ref 40–115)
ALT: 29 U/L (ref 9–46)
AST: 17 U/L (ref 10–35)
Bilirubin, Direct: <0.1 mg/dL (ref <=0.2)
TOTAL PROTEIN: 6 g/dL — AB (ref 6.1–8.1)
Total Bilirubin: 0.4 mg/dL (ref 0.2–1.2)

## 2015-11-24 LAB — LIPID PANEL
Cholesterol: 130 mg/dL (ref 125–200)
HDL: 40 mg/dL (ref >=40)
LDL Cholesterol: 58 mg/dL (ref <130)
Total CHOL/HDL Ratio: 3.3 Ratio (ref <=5.0)
Triglycerides: 162 mg/dL — ABNORMAL HIGH (ref <150)
VLDL: 32 mg/dL — ABNORMAL HIGH (ref <30)

## 2015-11-25 ENCOUNTER — Telehealth: Payer: Self-pay | Admitting: *Deleted

## 2015-11-25 NOTE — Telephone Encounter (Signed)
Per Wilburt FinlayBryan Hager, PA-C, called pt and let him know that his Cholesterol has improved tremendously and his liver function looks good as well.  Pt verbalized understanding.

## 2015-11-25 NOTE — Telephone Encounter (Signed)
-----   Message from Levi AlandMichelle M Swinyer, RN sent at 11/25/2015  9:10 AM EST -----   ----- Message -----    From: Dwana MelenaBryan W Hager, PA-C    Sent: 11/25/2015   8:51 AM      To: [facility] Triage  Please let [name] know his total cholesterol 239 to 130 and LDL decreased from 172 to 58!  Liver function labs look good.  Thanks,  Wilburt FinlayBryan Hager, [facility]

## 2015-11-30 ENCOUNTER — Telehealth: Payer: Self-pay | Admitting: Nurse Practitioner

## 2015-11-30 NOTE — Telephone Encounter (Signed)
   Pt called stating that he's been having headaches for a few days.  He called the other day and was advised to reduce his imdur to 1/2 tab and felt a little better but headache returned today.  I rec that he d/c imdur altogether for a few days to see if headache resolves completely.  He is currently taking percocet prn for h/a.  I advised that he should continue to avoid nsaids as he is on asa/effient.  If h/a worsens, he should present to ED for eval.  Caller verbalized understanding and was grateful for the call back.  Nicolasa Duckinghristopher Dorene Bruni, NP 11/30/2015, 5:46 PM

## 2015-12-01 ENCOUNTER — Encounter: Payer: Self-pay | Admitting: Cardiovascular Disease

## 2015-12-01 ENCOUNTER — Ambulatory Visit (INDEPENDENT_AMBULATORY_CARE_PROVIDER_SITE_OTHER): Payer: BLUE CROSS/BLUE SHIELD | Admitting: Cardiovascular Disease

## 2015-12-01 VITALS — BP 96/64 | HR 85 | Ht 70.0 in | Wt 202.0 lb

## 2015-12-01 DIAGNOSIS — E785 Hyperlipidemia, unspecified: Secondary | ICD-10-CM | POA: Diagnosis not present

## 2015-12-01 DIAGNOSIS — I319 Disease of pericardium, unspecified: Secondary | ICD-10-CM

## 2015-12-01 DIAGNOSIS — I2102 ST elevation (STEMI) myocardial infarction involving left anterior descending coronary artery: Secondary | ICD-10-CM

## 2015-12-01 DIAGNOSIS — Z720 Tobacco use: Secondary | ICD-10-CM | POA: Diagnosis not present

## 2015-12-01 DIAGNOSIS — I313 Pericardial effusion (noninflammatory): Secondary | ICD-10-CM

## 2015-12-01 DIAGNOSIS — I3139 Other pericardial effusion (noninflammatory): Secondary | ICD-10-CM

## 2015-12-01 MED ORDER — FUROSEMIDE 20 MG PO TABS: 20.0000 mg | ORAL_TABLET | Freq: Every day | ORAL | Status: DC

## 2015-12-01 NOTE — Progress Notes (Signed)
12/01/2015 Roger [name]   07-30-1951  [id number]  Primary Physician Roger Rowan, MD Primary Cardiologist: Runell Gess MD Roseanne Reno   HPI:  Mr. Roger [name] is a 65 year old gentleman with a history of recent anterior STEMI on 11/04/15. With direct intervention using a Promus struggling stent. He had noncritical disease otherwise. He did have ventricular fibrillation twice during the case requiring defibrillation but no CPR was performed. He was discharged home 4 days later. 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 A 2-D echo performed 11/11/15 revealed ejection fraction of 40-45% with anteroapical wall motion of the malleoli and mild to moderate posterior pericardial effusion. He saw Dr. Herbie [name] who began oral steroids. His symptoms have resolved. He was a smoker prior to this and hasn't stopped smoking.   Current Outpatient Prescriptions  Medication Sig Dispense Refill  . aspirin EC 81 MG EC tablet Take 1 tablet (81 mg total) by mouth daily. 30 tablet 11  . atorvastatin (LIPITOR) 80 MG tablet Take 1 tablet (80 mg total) by mouth every evening. 30 tablet 6  . azithromycin (ZITHROMAX) 250 MG tablet Take 1 tablet (250 mg total) by mouth daily. Will finish course on 11/10/15 2 each 0  . Cholecalciferol (VITAMIN D3) 1000 UNITS CAPS Take 1,000 Units by mouth daily as needed (takes occasionally).    . furosemide (LASIX) 20 MG tablet Take 2 tablets (40 mg total) by mouth daily. Take extra dose if needed 180 tablet 2  . ibuprofen (ADVIL,MOTRIN) 200 MG tablet Take 1 tablet (200 mg total) by mouth every 8 (eight) hours as needed. 90 tablet 0  . metoprolol tartrate (LOPRESSOR) 25 MG tablet Take 1 tablet (25 mg total) by mouth 2 (two) times daily. 60 tablet 6  . nitroGLYCERIN (NITROSTAT) 0.4 MG SL tablet Place 1 tablet (0.4 mg total) under the tongue every 5 (five) minutes as needed for chest pain (up to 3  doses). 25 tablet 3  . oxyCODONE-acetaminophen (PERCOCET/ROXICET) 5-325 MG tablet Take 1-2 tablets by mouth every 8 (eight) hours as needed for severe pain. 126 tablet 0  . potassium chloride (K-DUR,KLOR-CON) 10 MEQ tablet Take 1 tablet (10 mEq total) by mouth daily. 30 tablet 6  . prasugrel (EFFIENT) 10 MG TABS tablet Take 1 tablet (10 mg total) by mouth daily. 30 tablet 11  . predniSONE (DELTASONE) 10 MG tablet Take as directed --60 mg for 3 days,40 mg for 3 days, 20 mg for 3 days,10 mg for 3 days then stop 39 tablet 0   No current facility-administered medications for this visit.    No Known Allergies  Social History   Social History  . Marital Status: Married    Spouse Name: N/A  . Number of Children: N/A  . Years of Education: N/A   Occupational History  . Not on file.   Social History Main Topics  . Smoking status: Current Every Day Smoker  . Smokeless tobacco: Never Used  . Alcohol Use: Yes  . Drug Use: No  . Sexual Activity: Not on file     Comment: married   Other Topics Concern  . Not on file   Social History Narrative     Review of Systems: General: negative for chills, fever, night sweats or weight changes.  Cardiovascular: negative for chest pain, dyspnea on exertion, edema, orthopnea, palpitations, paroxysmal nocturnal dyspnea or shortness of breath Dermatological: negative for rash Respiratory: negative for cough or wheezing Urologic: negative  for hematuria Abdominal: negative for nausea, vomiting, diarrhea, bright red blood per rectum, melena, or hematemesis Neurologic: negative for visual changes, syncope, or dizziness All other systems reviewed and are otherwise negative except as noted above.    Blood pressure 96/64, pulse 85, height  (1.778 m), weight 202 lb (91.627 kg).  General appearance: alert and no distress Neck: no adenopathy, no carotid bruit, no JVD, supple, symmetrical, trachea midline and thyroid not enlarged, symmetric, no  tenderness/mass/nodules Lungs: clear to auscultation bilaterally Heart: regular rate and rhythm, S1, S2 normal, no murmur, click, rub or gallop Extremities: extremities normal, atraumatic, no cyanosis or edema  EKG not performed today  ASSESSMENT AND PLAN:   Tobacco abuse History tobacco abuse, discontinued.  ST elevation (STEMI) myocardial infarction involving left anterior descending coronary artery (HCC) History of anterior STEMI 11/04/15 field with PCI and stenting of his proximal LAD with a Promus drug-eluting stent. He did have ventricular fibrillation twice during the procedure requiring defibrillation but no CPR. His EF was in the 40-45% range. He was discharged 4 days later. He did have post MI pericarditis/Dressler syndrome which was fairly pronounced requiring treatment with NSAIDS and ultimately steroids. His last 2-D echo performed 11/11/15 revealed an EF of 40-45% with anteroapical wall motion abnormality and a 6 small to moderate-sized posterior pericardial effusion. His symptoms have since resolved.  Hyperlipidemia History of hyperlipidemia on atorvastatin 80 with recent lipid profile performed 11/24/15 revealing a total pressure 1:30, LDL 58 and HDL 40. This represents a dramatic decline from his lipid profile performed 11/03/15 when his LDL is 172. He has really changed his diet as well      Runell Gess MD [facility], [facility] 12/01/2015 4:17 PM

## 2015-12-01 NOTE — Assessment & Plan Note (Addendum)
History of hyperlipidemia on atorvastatin 80 with recent lipid profile performed 11/24/15 revealing a total pressure 1:30, LDL 58 and HDL 40. This represents a dramatic decline from his lipid profile performed 11/03/15 when his LDL is 172. He has really changed his diet as well

## 2015-12-01 NOTE — Patient Instructions (Addendum)
Medication Instructions:  Your physician has recommended you make the following change in your medication:  1) STOP Imdur 2) Decrease Lasix to 20 mg by mouth ONCE daily   Labwork: none  Testing/Procedures: Your physician has requested that you have an echocardiogram. Echocardiography is a painless test that uses sound waves to create images of your heart. It provides your doctor with information about the size and shape of your heart and how well your heart's chambers and valves are working. This procedure takes approximately one hour. There are no restrictions for this procedure. SCHEDULE IN 6 MONTHS BEFORE SEEING DR. Allyson Sabal.   Follow-Up: Your physician recommends that you schedule a follow-up appointment in: 3 months with Wilburt Finlay, PA  Your physician wants you to follow-up in: 6 months with Dr. Allyson Sabal. You will receive a reminder letter in the mail two months in advance. If you don't receive a letter, please call our office to schedule the follow-up appointment.   Any Other Special Instructions Will Be Listed Below (If Applicable).     If you need a refill on your cardiac medications before your next appointment, please call your pharmacy.

## 2015-12-01 NOTE — Assessment & Plan Note (Addendum)
History of anterior STEMI 11/04/15 field with PCI and stenting of his proximal LAD with a Promus drug-eluting stent. He did have ventricular fibrillation twice during the procedure requiring defibrillation but no CPR. His EF was in the 40-45% range. He was discharged 4 days later. He did have post MI pericarditis/Dressler syndrome which was fairly pronounced requiring treatment with NSAIDS and ultimately steroids. His last 2-D echo performed 11/11/15 revealed an EF of 40-45% with anteroapical wall motion abnormality and a 6 small to moderate-sized posterior pericardial effusion. His symptoms have since resolved.

## 2015-12-01 NOTE — Assessment & Plan Note (Signed)
History tobacco abuse, discontinued.

## 2015-12-03 ENCOUNTER — Encounter (HOSPITAL_COMMUNITY)
Admission: RE | Admit: 2015-12-03 | Discharge: 2015-12-03 | Disposition: A | Discharge status: Home / Self Care | Payer: BLUE CROSS/BLUE SHIELD | Source: Ambulatory Visit | Attending: Cardiovascular Disease | Admitting: Cardiovascular Disease

## 2015-12-03 DIAGNOSIS — Z955 Presence of coronary angioplasty implant and graft: Secondary | ICD-10-CM | POA: Insufficient documentation

## 2015-12-03 DIAGNOSIS — I213 ST elevation (STEMI) myocardial infarction of unspecified site: Secondary | ICD-10-CM | POA: Insufficient documentation

## 2015-12-03 NOTE — Progress Notes (Signed)
Cardiac Rehab Medication Review by a Pharmacist  Does the patient  feel that his/her medications are working for him/her?  yes  Has the patient been experiencing any side effects to the medications prescribed?  yes  Does the patient measure his/her own blood pressure or blood glucose at home?  no   Does the patient have any problems obtaining medications due to transportation or finances?   no  Understanding of regimen: good Understanding of indications: excellent Potential of compliance: excellent    Pharmacist comments: Discussed side effects of medications. Pt states he recently was taking a long acting nitrate but his physician recommended he stop taking it due to intolerable headaches. Recommended he uses APAP for pain instead of Goody's powder or extra aspirin tablets. Answered all pt questions.   Roger [name], PharmD Clinical Pharmacy Resident Pager: [phone] 12/03/2015 9:00 AM

## 2015-12-07 ENCOUNTER — Encounter (HOSPITAL_COMMUNITY)
Admission: RE | Admit: 2015-12-07 | Discharge: 2015-12-07 | Disposition: A | Discharge status: Home / Self Care | Payer: BLUE CROSS/BLUE SHIELD | Source: Ambulatory Visit | Attending: Cardiovascular Disease | Admitting: Cardiovascular Disease

## 2015-12-07 DIAGNOSIS — Z955 Presence of coronary angioplasty implant and graft: Secondary | ICD-10-CM | POA: Diagnosis not present

## 2015-12-07 DIAGNOSIS — I213 ST elevation (STEMI) myocardial infarction of unspecified site: Secondary | ICD-10-CM | POA: Diagnosis not present

## 2015-12-07 NOTE — Progress Notes (Signed)
Pt started exercise today at the 8:15 cardiac rehab phase II program. .  Pt tolerated light exercise without difficulty. VSS, telemetry-SR with no noted ectopy, asymptomatic.  Medication list reconciled.  Pt verbalized compliance with medications and denies barriers to compliance. PSYCHOSOCIAL ASSESSMENT:  PHQ-0. Pt exhibits positive coping skills, hopeful outlook with supportive family. No psychosocial needs identified at this time, no psychosocial interventions necessary.  Pt feels good about his recovery and feels because of his "heart stopping " during the cath he has a renewed sense of purpose. This was a surprise to pt and he felt he was otherwise healthy.  Pt enjoys music, playing the guitar, mandolin and fiddle.   Pt cardiac rehab  goal is  to  Have no more heart events feel better overall.  Pt defines feeling better with increased strength and stamina.  Pt would like to be able to do normal activities at home and work without feeling fatigued.  Pt encouraged to participate in consistent home exercise to increase ability to achieve these goals. Will have periodic checks with pt to review MET levels.  Pt long term cardiac rehab goal is no more heart events. Will monitor pt progress toward achieving the goals. Pt oriented to exercise equipment and routine.  Understanding verbalized. Cherre Huger, BSN

## 2015-12-08 ENCOUNTER — Encounter: Payer: Self-pay | Admitting: Cardiovascular Disease

## 2015-12-09 ENCOUNTER — Encounter (HOSPITAL_COMMUNITY)
Admission: RE | Admit: 2015-12-09 | Discharge: 2015-12-09 | Disposition: A | Discharge status: Home / Self Care | Payer: BLUE CROSS/BLUE SHIELD | Source: Ambulatory Visit | Attending: Cardiovascular Disease | Admitting: Cardiovascular Disease

## 2015-12-09 DIAGNOSIS — I213 ST elevation (STEMI) myocardial infarction of unspecified site: Secondary | ICD-10-CM | POA: Diagnosis not present

## 2015-12-11 ENCOUNTER — Encounter (HOSPITAL_COMMUNITY)
Admission: RE | Admit: 2015-12-11 | Discharge: 2015-12-11 | Disposition: A | Discharge status: Home / Self Care | Payer: BLUE CROSS/BLUE SHIELD | Source: Ambulatory Visit | Attending: Cardiovascular Disease | Admitting: Cardiovascular Disease

## 2015-12-11 DIAGNOSIS — I213 ST elevation (STEMI) myocardial infarction of unspecified site: Secondary | ICD-10-CM | POA: Diagnosis not present

## 2015-12-11 NOTE — Progress Notes (Signed)
QUALITY OF LIFE SCORE REVIEW  Pt completed Quality of Life survey as a participant in Cardiac Rehab.  Overall pt scores very satisfactory. Pt denies any hardships or barriers at this time. Pt reports he has supportive family, married with a stepson and plans to visit his grandchildren, ages 69 and 14 this weekend.  Pt enjoys playing bluegrass music, riding motorcycles and formerly studied Doctor, general practice.   Offered emotional support and encouragement for his rehab participation.  Will continue to monitor and intervene as necessary.

## 2015-12-14 ENCOUNTER — Encounter (HOSPITAL_COMMUNITY)
Admission: RE | Admit: 2015-12-14 | Discharge: 2015-12-14 | Disposition: A | Discharge status: Home / Self Care | Payer: BLUE CROSS/BLUE SHIELD | Source: Ambulatory Visit | Attending: Cardiovascular Disease | Admitting: Cardiovascular Disease

## 2015-12-14 DIAGNOSIS — I213 ST elevation (STEMI) myocardial infarction of unspecified site: Secondary | ICD-10-CM | POA: Diagnosis not present

## 2015-12-16 ENCOUNTER — Encounter (HOSPITAL_COMMUNITY)
Admission: RE | Admit: 2015-12-16 | Discharge: 2015-12-16 | Disposition: A | Discharge status: Home / Self Care | Payer: BLUE CROSS/BLUE SHIELD | Source: Ambulatory Visit | Attending: Cardiovascular Disease | Admitting: Cardiovascular Disease

## 2015-12-16 DIAGNOSIS — Z955 Presence of coronary angioplasty implant and graft: Secondary | ICD-10-CM | POA: Insufficient documentation

## 2015-12-16 DIAGNOSIS — I213 ST elevation (STEMI) myocardial infarction of unspecified site: Secondary | ICD-10-CM | POA: Diagnosis not present

## 2015-12-17 ENCOUNTER — Encounter: Payer: Self-pay | Admitting: Cardiovascular Disease

## 2015-12-18 ENCOUNTER — Encounter (HOSPITAL_COMMUNITY)
Admission: RE | Admit: 2015-12-18 | Discharge: 2015-12-18 | Disposition: A | Discharge status: Home / Self Care | Payer: BLUE CROSS/BLUE SHIELD | Source: Ambulatory Visit | Attending: Cardiovascular Disease | Admitting: Cardiovascular Disease

## 2015-12-18 DIAGNOSIS — I213 ST elevation (STEMI) myocardial infarction of unspecified site: Secondary | ICD-10-CM | POA: Diagnosis not present

## 2015-12-21 ENCOUNTER — Encounter (HOSPITAL_COMMUNITY)
Admission: RE | Admit: 2015-12-21 | Discharge: 2015-12-21 | Disposition: A | Discharge status: Home / Self Care | Payer: BLUE CROSS/BLUE SHIELD | Source: Ambulatory Visit | Attending: Cardiovascular Disease | Admitting: Cardiovascular Disease

## 2015-12-21 DIAGNOSIS — I213 ST elevation (STEMI) myocardial infarction of unspecified site: Secondary | ICD-10-CM | POA: Diagnosis not present

## 2015-12-23 ENCOUNTER — Encounter (HOSPITAL_COMMUNITY)
Admission: RE | Admit: 2015-12-23 | Discharge: 2015-12-23 | Disposition: A | Discharge status: Home / Self Care | Payer: BLUE CROSS/BLUE SHIELD | Source: Ambulatory Visit | Attending: Cardiovascular Disease | Admitting: Cardiovascular Disease

## 2015-12-23 DIAGNOSIS — I213 ST elevation (STEMI) myocardial infarction of unspecified site: Secondary | ICD-10-CM | POA: Diagnosis not present

## 2015-12-23 NOTE — Progress Notes (Signed)
Reviewed home exercise with pt on 12/21/2015.  Pt plans to walk 2-3 bouts for 10-15 minutes each for exercise.  Pt and discussed bouts of exercise due to pain in legs and feet when standing on feet for too long. Reviewed THR, pulse, RPE, sign and symptoms, NTG use, and when to call 911 or MD.  Pt voiced understanding.    Quanetta [employer]

## 2015-12-23 NOTE — Progress Notes (Signed)
Roger [name] 65 y.o. male Nutrition Note Spoke with pt. Nutrition Plan and Nutrition Survey goals reviewed with pt. Pt is following Step 2 of the Therapeutic Lifestyle Changes diet. Pt expressed unhappiness with his current diet stating "I can't eat anything I like." Pt overwhelmed with the number of changes he has had to make over a short time period (e.g. Tobacco cessation, diet changes including limiting simple sugars and sodium). Pt's perception of the heart healthy diet discussed. Pt encouraged to make small, purposeful diet goals rather than tackling his entire diet at one time. Pt's A1c elevated. Pt aware of "pre-diabetes diagnosis." Pt reports his A1c was rechecked recently but he does not know what the number was. Pt with dx of CHF. Per discussion, pt does not use canned/convenience foods often. Pt rarely adds salt to food. Pt eats out infrequently now. Pt states he used to enjoy going out to eat daily as a form of stress relief during the work day. Alternatives to eating out and getting stress relief discussed. Pt expressed understanding of the information reviewed. Pt aware of nutrition education classes offered and plans on attending nutrition classes.  Lab Results  Component Value Date   HGBA1C 6.5* 11/04/2015   Wt Readings from Last 3 Encounters:  12/03/15 201 lb 4.5 oz (91.3 kg)  12/01/15 202 lb (91.627 kg)  11/19/15 211 lb (95.709 kg)    Nutrition Diagnosis ? Food-and nutrition-related knowledge deficit related to lack of exposure to information as related to diagnosis of: ? CVD ? Pre-diabetes ? Overweight related to excessive energy intake as evidenced by a BMI of 29.7  Nutrition RX/ Estimated Daily Nutrition Needs for: wt loss 1550-2050 Kcal, 40-55 gm fat, 10-14 gm sat fat, 1.5-2.0 gm trans-fat, <1500 mg sodium  Nutrition Intervention ? Pt's individual nutrition plan reviewed with pt. ? Benefits of adopting Therapeutic Lifestyle Changes discussed when Medficts  reviewed. ? Pt to attend the Portion Distortion class ? Pt to attend the   ? Nutrition I class                     ? Nutrition II class  ? Pt given handouts for: ? Nutrition I class ? Nutrition II class  ? Continue client-centered nutrition education by RD, as part of interdisciplinary care. Goal(s) ? Pt to identify food quantities necessary to achieve weight loss of 6-24 lb (2.7-10.9 kg) at graduation from cardiac rehab.  ? Pt able to name foods that affect blood glucose  Monitor and Evaluate progress toward nutrition goal with team. Mickle Plumb, M.Ed, RD, LDN, CDE 12/23/2015 9:31 AM

## 2015-12-24 ENCOUNTER — Other Ambulatory Visit (INDEPENDENT_AMBULATORY_CARE_PROVIDER_SITE_OTHER): Payer: BLUE CROSS/BLUE SHIELD | Admitting: *Deleted

## 2015-12-24 DIAGNOSIS — E785 Hyperlipidemia, unspecified: Secondary | ICD-10-CM

## 2015-12-24 LAB — LIPID PANEL
Cholesterol: 116 mg/dL — ABNORMAL LOW (ref 125–200)
HDL: 42 mg/dL (ref >=40)
LDL CALC: 54 mg/dL (ref <130)
Total CHOL/HDL Ratio: 2.8 Ratio (ref <=5.0)
Triglycerides: 101 mg/dL (ref <150)
VLDL: 20 mg/dL (ref <30)

## 2015-12-24 LAB — HEPATIC FUNCTION PANEL
ALK PHOS: 116 U/L — AB (ref 40–115)
ALT: 41 U/L (ref 9–46)
AST: 20 U/L (ref 10–35)
Albumin: 3.9 g/dL (ref 3.6–5.1)
BILIRUBIN INDIRECT: 0.4 mg/dL (ref 0.2–1.2)
Bilirubin, Direct: 0.1 mg/dL (ref <=0.2)
TOTAL PROTEIN: 6.4 g/dL (ref 6.1–8.1)
Total Bilirubin: 0.5 mg/dL (ref 0.2–1.2)

## 2015-12-24 NOTE — Addendum Note (Signed)
Addended by: BOWDEN, ROBIN K on: 12/24/2015 08:41 AM   Modules accepted: Orders  

## 2015-12-24 NOTE — Addendum Note (Signed)
Addended by: Tonita Phoenix on: 12/24/2015 08:41 AM   Modules accepted: Orders

## 2015-12-25 ENCOUNTER — Encounter (HOSPITAL_COMMUNITY)
Admission: RE | Admit: 2015-12-25 | Discharge: 2015-12-25 | Disposition: A | Discharge status: Home / Self Care | Payer: BLUE CROSS/BLUE SHIELD | Source: Ambulatory Visit | Attending: Cardiovascular Disease | Admitting: Cardiovascular Disease

## 2015-12-25 DIAGNOSIS — I213 ST elevation (STEMI) myocardial infarction of unspecified site: Secondary | ICD-10-CM | POA: Diagnosis not present

## 2015-12-28 ENCOUNTER — Encounter (HOSPITAL_COMMUNITY)
Admission: RE | Admit: 2015-12-28 | Discharge: 2015-12-28 | Disposition: A | Discharge status: Home / Self Care | Payer: BLUE CROSS/BLUE SHIELD | Source: Ambulatory Visit | Attending: Cardiovascular Disease | Admitting: Cardiovascular Disease

## 2015-12-28 DIAGNOSIS — I213 ST elevation (STEMI) myocardial infarction of unspecified site: Secondary | ICD-10-CM | POA: Diagnosis not present

## 2015-12-29 ENCOUNTER — Telehealth: Payer: Self-pay | Admitting: Cardiovascular Disease

## 2015-12-29 NOTE — Telephone Encounter (Signed)
New message ° ° °Patient returning call back to nurse.  °

## 2015-12-29 NOTE — Telephone Encounter (Signed)
Returned pt's call. LMPTCB jw 12/29/15

## 2015-12-29 NOTE — Telephone Encounter (Signed)
-----   Message from Dwana Melena, PA-C sent at 12/29/2015 12:51 PM EST ----- Please give Mr. Willmann his lipid results which look good.  Liver function test normal.    Thanks, Wilburt Finlay, PAC

## 2015-12-30 ENCOUNTER — Encounter (HOSPITAL_COMMUNITY)
Admission: RE | Admit: 2015-12-30 | Discharge: 2015-12-30 | Disposition: A | Discharge status: Home / Self Care | Payer: BLUE CROSS/BLUE SHIELD | Source: Ambulatory Visit | Attending: Cardiovascular Disease | Admitting: Cardiovascular Disease

## 2015-12-30 ENCOUNTER — Telehealth: Payer: Self-pay | Admitting: Cardiovascular Disease

## 2015-12-30 DIAGNOSIS — I213 ST elevation (STEMI) myocardial infarction of unspecified site: Secondary | ICD-10-CM | POA: Diagnosis not present

## 2015-12-30 NOTE — Telephone Encounter (Signed)
Pt rtn call to [facility] re test results

## 2015-12-30 NOTE — Telephone Encounter (Signed)
Spoke with Clydie Braun, pt spouse, and she has been made aware of pt's CBC results and verbalized understanding.

## 2016-01-01 ENCOUNTER — Encounter (HOSPITAL_COMMUNITY)
Admission: RE | Admit: 2016-01-01 | Discharge: 2016-01-01 | Disposition: A | Discharge status: Home / Self Care | Payer: BLUE CROSS/BLUE SHIELD | Source: Ambulatory Visit | Attending: Cardiovascular Disease | Admitting: Cardiovascular Disease

## 2016-01-01 DIAGNOSIS — I213 ST elevation (STEMI) myocardial infarction of unspecified site: Secondary | ICD-10-CM | POA: Diagnosis not present

## 2016-01-04 ENCOUNTER — Encounter (HOSPITAL_COMMUNITY)
Admission: RE | Admit: 2016-01-04 | Discharge: 2016-01-04 | Disposition: A | Discharge status: Home / Self Care | Payer: BLUE CROSS/BLUE SHIELD | Source: Ambulatory Visit | Attending: Cardiovascular Disease | Admitting: Cardiovascular Disease

## 2016-01-04 DIAGNOSIS — I213 ST elevation (STEMI) myocardial infarction of unspecified site: Secondary | ICD-10-CM | POA: Diagnosis not present

## 2016-01-06 ENCOUNTER — Encounter (HOSPITAL_COMMUNITY)
Admission: RE | Admit: 2016-01-06 | Discharge: 2016-01-06 | Disposition: A | Discharge status: Home / Self Care | Payer: BLUE CROSS/BLUE SHIELD | Source: Ambulatory Visit | Attending: Cardiovascular Disease | Admitting: Cardiovascular Disease

## 2016-01-06 DIAGNOSIS — I213 ST elevation (STEMI) myocardial infarction of unspecified site: Secondary | ICD-10-CM | POA: Diagnosis not present

## 2016-01-08 ENCOUNTER — Encounter (HOSPITAL_COMMUNITY)
Admission: RE | Admit: 2016-01-08 | Discharge: 2016-01-08 | Disposition: A | Discharge status: Home / Self Care | Payer: BLUE CROSS/BLUE SHIELD | Source: Ambulatory Visit | Attending: Cardiovascular Disease | Admitting: Cardiovascular Disease

## 2016-01-08 DIAGNOSIS — I213 ST elevation (STEMI) myocardial infarction of unspecified site: Secondary | ICD-10-CM | POA: Diagnosis not present

## 2016-01-11 ENCOUNTER — Encounter (HOSPITAL_COMMUNITY)
Admission: RE | Admit: 2016-01-11 | Discharge: 2016-01-11 | Disposition: A | Discharge status: Home / Self Care | Payer: BLUE CROSS/BLUE SHIELD | Source: Ambulatory Visit | Attending: Cardiovascular Disease | Admitting: Cardiovascular Disease

## 2016-01-11 ENCOUNTER — Encounter: Payer: Self-pay | Admitting: Cardiovascular Disease

## 2016-01-11 DIAGNOSIS — I213 ST elevation (STEMI) myocardial infarction of unspecified site: Secondary | ICD-10-CM | POA: Diagnosis not present

## 2016-01-13 ENCOUNTER — Encounter (HOSPITAL_COMMUNITY)
Admission: RE | Admit: 2016-01-13 | Discharge: 2016-01-13 | Disposition: A | Discharge status: Home / Self Care | Payer: BLUE CROSS/BLUE SHIELD | Source: Ambulatory Visit | Attending: Cardiovascular Disease | Admitting: Cardiovascular Disease

## 2016-01-13 DIAGNOSIS — I213 ST elevation (STEMI) myocardial infarction of unspecified site: Secondary | ICD-10-CM | POA: Diagnosis present

## 2016-01-13 DIAGNOSIS — Z955 Presence of coronary angioplasty implant and graft: Secondary | ICD-10-CM | POA: Diagnosis not present

## 2016-01-15 ENCOUNTER — Encounter (HOSPITAL_COMMUNITY)
Admission: RE | Admit: 2016-01-15 | Discharge: 2016-01-15 | Disposition: A | Discharge status: Home / Self Care | Payer: BLUE CROSS/BLUE SHIELD | Source: Ambulatory Visit | Attending: Cardiovascular Disease | Admitting: Cardiovascular Disease

## 2016-01-15 DIAGNOSIS — I213 ST elevation (STEMI) myocardial infarction of unspecified site: Secondary | ICD-10-CM | POA: Diagnosis not present

## 2016-01-18 ENCOUNTER — Encounter (HOSPITAL_COMMUNITY)
Admission: RE | Admit: 2016-01-18 | Discharge: 2016-01-18 | Disposition: A | Discharge status: Home / Self Care | Payer: BLUE CROSS/BLUE SHIELD | Source: Ambulatory Visit | Attending: Cardiovascular Disease | Admitting: Cardiovascular Disease

## 2016-01-18 DIAGNOSIS — I213 ST elevation (STEMI) myocardial infarction of unspecified site: Secondary | ICD-10-CM | POA: Diagnosis not present

## 2016-01-20 ENCOUNTER — Encounter (HOSPITAL_COMMUNITY)
Admission: RE | Admit: 2016-01-20 | Discharge: 2016-01-20 | Disposition: A | Discharge status: Home / Self Care | Payer: BLUE CROSS/BLUE SHIELD | Source: Ambulatory Visit | Attending: Cardiovascular Disease | Admitting: Cardiovascular Disease

## 2016-01-20 DIAGNOSIS — I213 ST elevation (STEMI) myocardial infarction of unspecified site: Secondary | ICD-10-CM | POA: Diagnosis not present

## 2016-01-22 ENCOUNTER — Encounter (HOSPITAL_COMMUNITY)
Admission: RE | Admit: 2016-01-22 | Discharge: 2016-01-22 | Disposition: A | Discharge status: Home / Self Care | Payer: BLUE CROSS/BLUE SHIELD | Source: Ambulatory Visit | Attending: Cardiovascular Disease | Admitting: Cardiovascular Disease

## 2016-01-22 DIAGNOSIS — I213 ST elevation (STEMI) myocardial infarction of unspecified site: Secondary | ICD-10-CM | POA: Diagnosis not present

## 2016-01-25 ENCOUNTER — Encounter (HOSPITAL_COMMUNITY)
Admission: RE | Admit: 2016-01-25 | Discharge: 2016-01-25 | Disposition: A | Discharge status: Home / Self Care | Payer: BLUE CROSS/BLUE SHIELD | Source: Ambulatory Visit | Attending: Cardiovascular Disease | Admitting: Cardiovascular Disease

## 2016-01-25 DIAGNOSIS — I213 ST elevation (STEMI) myocardial infarction of unspecified site: Secondary | ICD-10-CM | POA: Diagnosis not present

## 2016-01-27 ENCOUNTER — Encounter (HOSPITAL_COMMUNITY)
Admission: RE | Admit: 2016-01-27 | Discharge: 2016-01-27 | Disposition: A | Discharge status: Home / Self Care | Payer: BLUE CROSS/BLUE SHIELD | Source: Ambulatory Visit | Attending: Cardiovascular Disease | Admitting: Cardiovascular Disease

## 2016-01-27 DIAGNOSIS — I213 ST elevation (STEMI) myocardial infarction of unspecified site: Secondary | ICD-10-CM | POA: Diagnosis not present

## 2016-01-29 ENCOUNTER — Encounter (HOSPITAL_COMMUNITY)
Admission: RE | Admit: 2016-01-29 | Discharge: 2016-01-29 | Disposition: A | Discharge status: Home / Self Care | Payer: BLUE CROSS/BLUE SHIELD | Source: Ambulatory Visit | Attending: Cardiovascular Disease | Admitting: Cardiovascular Disease

## 2016-01-29 DIAGNOSIS — I213 ST elevation (STEMI) myocardial infarction of unspecified site: Secondary | ICD-10-CM | POA: Diagnosis not present

## 2016-02-01 ENCOUNTER — Encounter (HOSPITAL_COMMUNITY)
Admission: RE | Admit: 2016-02-01 | Discharge: 2016-02-01 | Disposition: A | Discharge status: Home / Self Care | Payer: BLUE CROSS/BLUE SHIELD | Source: Ambulatory Visit | Attending: Cardiovascular Disease | Admitting: Cardiovascular Disease

## 2016-02-01 DIAGNOSIS — I213 ST elevation (STEMI) myocardial infarction of unspecified site: Secondary | ICD-10-CM | POA: Diagnosis not present

## 2016-02-03 ENCOUNTER — Encounter (HOSPITAL_COMMUNITY)
Admission: RE | Admit: 2016-02-03 | Discharge: 2016-02-03 | Disposition: A | Discharge status: Home / Self Care | Payer: BLUE CROSS/BLUE SHIELD | Source: Ambulatory Visit | Attending: Cardiovascular Disease | Admitting: Cardiovascular Disease

## 2016-02-03 DIAGNOSIS — I213 ST elevation (STEMI) myocardial infarction of unspecified site: Secondary | ICD-10-CM | POA: Diagnosis not present

## 2016-02-03 NOTE — Progress Notes (Signed)
No psychosocial needs identfied, no interventions necessary.  Will continue to monitor.   Pt is exercising on his own walking. Pt is enjoying his pastime, motorcycle riding without difficulty.

## 2016-02-05 ENCOUNTER — Encounter (HOSPITAL_COMMUNITY)
Admission: RE | Admit: 2016-02-05 | Discharge: 2016-02-05 | Disposition: A | Discharge status: Home / Self Care | Payer: BLUE CROSS/BLUE SHIELD | Source: Ambulatory Visit | Attending: Cardiovascular Disease | Admitting: Cardiovascular Disease

## 2016-02-05 DIAGNOSIS — I213 ST elevation (STEMI) myocardial infarction of unspecified site: Secondary | ICD-10-CM | POA: Diagnosis not present

## 2016-02-08 ENCOUNTER — Encounter (HOSPITAL_COMMUNITY)
Admission: RE | Admit: 2016-02-08 | Discharge: 2016-02-08 | Disposition: A | Discharge status: Home / Self Care | Payer: BLUE CROSS/BLUE SHIELD | Source: Ambulatory Visit | Attending: Cardiovascular Disease | Admitting: Cardiovascular Disease

## 2016-02-08 DIAGNOSIS — I213 ST elevation (STEMI) myocardial infarction of unspecified site: Secondary | ICD-10-CM | POA: Diagnosis not present

## 2016-02-10 ENCOUNTER — Encounter (HOSPITAL_COMMUNITY)
Admission: RE | Admit: 2016-02-10 | Discharge: 2016-02-10 | Disposition: A | Discharge status: Home / Self Care | Payer: BLUE CROSS/BLUE SHIELD | Source: Ambulatory Visit | Attending: Cardiovascular Disease | Admitting: Cardiovascular Disease

## 2016-02-10 DIAGNOSIS — I213 ST elevation (STEMI) myocardial infarction of unspecified site: Secondary | ICD-10-CM | POA: Diagnosis not present

## 2016-02-10 NOTE — Progress Notes (Signed)
Pt c/o episodes of dyspnea on exertion while exercising at home. Pt denies dyspnea with cardiac rehab activities. Pt states he is walking outside in his neighborhood and he notices dyspnea while walking up hill only. Pt states dyspnea resolves as he is walking. O2sat -97%.  Pt has scheduled appt with Dr. Allyson SabalBerry on 02/16/16 and will discuss these symptoms with him at that time. Will continue to monitor.

## 2016-02-12 ENCOUNTER — Encounter (HOSPITAL_COMMUNITY)
Admission: RE | Admit: 2016-02-12 | Discharge: 2016-02-12 | Disposition: A | Discharge status: Home / Self Care | Payer: BLUE CROSS/BLUE SHIELD | Source: Ambulatory Visit | Attending: Cardiovascular Disease | Admitting: Cardiovascular Disease

## 2016-02-12 DIAGNOSIS — I213 ST elevation (STEMI) myocardial infarction of unspecified site: Secondary | ICD-10-CM | POA: Diagnosis not present

## 2016-02-15 ENCOUNTER — Encounter (HOSPITAL_COMMUNITY)
Admission: RE | Admit: 2016-02-15 | Discharge: 2016-02-15 | Disposition: A | Discharge status: Home / Self Care | Payer: BLUE CROSS/BLUE SHIELD | Source: Ambulatory Visit | Attending: Cardiovascular Disease | Admitting: Cardiovascular Disease

## 2016-02-15 DIAGNOSIS — I213 ST elevation (STEMI) myocardial infarction of unspecified site: Secondary | ICD-10-CM | POA: Diagnosis present

## 2016-02-15 DIAGNOSIS — Z955 Presence of coronary angioplasty implant and graft: Secondary | ICD-10-CM | POA: Insufficient documentation

## 2016-02-15 NOTE — Progress Notes (Signed)
Pt completed a 6 min walk test today for post evaluation - pt will graduate in two weeks.  At the completion of the walk test, pt reported after the fact that he had chest tightness after the third or fourth lap.  Pain resolved at the completion with no further reoccurrence during post measurements.  Monitor strip remained unchanged  Pt then disclosed that he took ntg over the weekend for chest pressure that was non exertional.  Pt further disclosed that he has periodically had some chest tightness here at cardiac rehab "off and on" not reported at the time of the symptoms.  Pt has an appointment on tomorrow with Huey BienenstockBrian Hager at 8am.  Pt wife will accompany him to the appt - pt reluctant to answer questions fully.  Will follow up post appointment. Alanson Alyarlette Carlton RN, BSN

## 2016-02-16 ENCOUNTER — Encounter: Payer: Self-pay | Admitting: Physician Assistant

## 2016-02-16 ENCOUNTER — Ambulatory Visit (INDEPENDENT_AMBULATORY_CARE_PROVIDER_SITE_OTHER): Payer: BLUE CROSS/BLUE SHIELD | Admitting: Physician Assistant

## 2016-02-16 VITALS — BP 110/70 | HR 80 | Ht 69.0 in | Wt 207.0 lb

## 2016-02-16 DIAGNOSIS — R079 Chest pain, unspecified: Secondary | ICD-10-CM | POA: Insufficient documentation

## 2016-02-16 DIAGNOSIS — E785 Hyperlipidemia, unspecified: Secondary | ICD-10-CM

## 2016-02-16 DIAGNOSIS — I2510 Atherosclerotic heart disease of native coronary artery without angina pectoris: Secondary | ICD-10-CM

## 2016-02-16 DIAGNOSIS — I5041 Acute combined systolic (congestive) and diastolic (congestive) heart failure: Secondary | ICD-10-CM | POA: Diagnosis not present

## 2016-02-16 DIAGNOSIS — I255 Ischemic cardiomyopathy: Secondary | ICD-10-CM | POA: Diagnosis not present

## 2016-02-16 DIAGNOSIS — I209 Angina pectoris, unspecified: Secondary | ICD-10-CM | POA: Diagnosis not present

## 2016-02-16 DIAGNOSIS — I5042 Chronic combined systolic (congestive) and diastolic (congestive) heart failure: Secondary | ICD-10-CM

## 2016-02-16 DIAGNOSIS — Z9861 Coronary angioplasty status: Secondary | ICD-10-CM

## 2016-02-16 DIAGNOSIS — R0789 Other chest pain: Secondary | ICD-10-CM

## 2016-02-16 MED ORDER — ATORVASTATIN CALCIUM 40 MG PO TABS: 40.0000 mg | ORAL_TABLET | Freq: Every evening | ORAL | Status: DC

## 2016-02-16 NOTE — Patient Instructions (Addendum)
Medication Instructions:  Your physician has recommended you make the following change in your medication:  REDUCE Atorvastatin to 40mg  daily   Labwork: None ordered  Testing/Procedures: Your physician has requested that you have en exercise stress myoview. For further information please visit [email or web address]. Please follow instruction sheet, as given.   Follow-Up: Your physician wants you to follow-up in: 4 months with Dr.Berry You will receive a reminder letter in the mail two months in advance. If you don't receive a letter, please call our office to schedule the follow-up appointment.   Any Other Special Instructions Will Be Listed Below (If Applicable).     If you need a refill on your cardiac medications before your next appointment, please call your pharmacy.

## 2016-02-16 NOTE — Progress Notes (Signed)
Patient ID: Roger [name], male   DOB: 10/09/1951, 64 y.o.   MRN: [id number]    Date:  02/16/2016   ID:  Roger [name], DOB 01/14/1951, MRN [id number]  PCP:  DEWEY,ELIZABETH, MD  Primary Cardiologist:  Berry  Chief Complaint  Patient presents with  . Follow-up    tightness in chest with walking, no swelling or cramping, shortness of breath when tightness in chest     History of Present Illness: Roger [name] is a 64 y.o. male with a history of anterior STEMI on 11/04/15. With direct intervention using a Promus struggling stent. He had noncritical disease otherwise. He did have ventricular fibrillation twice during the case requiring defibrillation but no CPR was performed. He was discharged home 4 days later. 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 A 2-D echo performed 11/11/15 revealed ejection fraction of 40-45% with anteroapical wall motion abnormality and mild to moderate posterior pericardial effusion. He saw Dr. Harding who began oral steroids. His symptoms resolved. He was a smoker prior to this and hasn't stopped smoking  The patient reports having difficulty catching his breath and chest tightness.  The tightness was severe enough that he took  NTG which resolved the feeling in 2-3 minutes.  It occurred at rest.  While at cardiac rehab during the 6 minutes walk the, "pt reported after the fact that he had chest tightness after the third or fourth lap. Pain resolved at the completion with no further reoccurrence during post measurements."  He reports no other symptoms.  He currently denies nausea, vomiting, fever, orthopnea, dizziness, PND, cough, congestion, abdominal pain, hematochezia, melena, lower extremity edema, claudication.  Wt Readings from Last 3 Encounters:  02/16/16 207 lb (93.895 kg)  12/03/15 201 lb 4.5 oz (91.3 kg)  12/01/15 202 lb (91.627 kg)     Past Medical History  Diagnosis  Date  . ST elevation (STEMI) myocardial infarction involving left anterior descending coronary artery (HCC) 11/04/2015    LAD 100%,   . CAD S/P percutaneous coronary angioplasty 11/04/2015    a. Ant STEMI 10/2015 - s/p DES to LAD , (Promus Premier DES 2.75 mm x 16 mm; 3.3 mm), residual ~50+% ulcerated RCA disease being treated medically.   . CAD (coronary artery disease)     a. Ant STEMI 10/2015 - s/p DES to LAD, residual RCA disease being treated medically.   . Sustained VT (ventricular tachycardia) (HCC)     a. Ischemic -- Had VT-VF during Cath for Anterior STEMI - Defib x 2  . Ischemic cardiomyopathy     a. 12/22 Echo: Post anterior STEMI EF 45-50%, followup echo 11/11/15 EF 40-45%.  . Chronic combined systolic and diastolic CHF (congestive heart failure) (HCC)     a. 2D echo 10/2015 at time of MI: EF 45-50%, grade 1 DD.  . Hyperglycemia     a. 10/2015 - A1C 6.5.  . 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 Tobacco abuse   . Hyperlipidemia with target LDL less than 70   . Kidney calculus   . Colon polyp     Current Outpatient Prescriptions  Medication Sig Dispense Refill  . aspirin EC 81 MG EC tablet Take 1 tablet (81 mg total) by mouth daily. 30 tablet 11  . atorvastatin (LIPITOR) 40 MG tablet Take 1 tablet (40 mg total) by mouth every evening. 30 tablet 6  . Cholecalciferol (VITAMIN D3) 1000   UNITS CAPS Take 1,000 Units by mouth daily as needed (takes occasionally).    . furosemide (LASIX) 20 MG tablet Take 1 tablet (20 mg total) by mouth daily. Take extra dose if needed 180 tablet 2  . metoprolol tartrate (LOPRESSOR) 25 MG tablet Take 1 tablet (25 mg total) by mouth 2 (two) times daily. 60 tablet 6  . nitroGLYCERIN (NITROSTAT) 0.4 MG SL tablet Place 1 tablet (0.4 mg total) under the tongue every 5 (five) minutes as needed for chest pain (up to 3 doses). 25 tablet 3  . potassium chloride (K-DUR,KLOR-CON) 10 MEQ tablet Take 1 tablet (10 mEq total) by mouth  daily. 30 tablet 6  . prasugrel (EFFIENT) 10 MG TABS tablet Take 1 tablet (10 mg total) by mouth daily. 30 tablet 11   No current facility-administered medications for this visit.    Allergies:   No Known Allergies  Social History:  The patient  reports that he quit smoking about 3 months ago. He has never used smokeless tobacco. He reports that he drinks alcohol. He reports that he does not use illicit drugs.   Family history:   Family History  Problem Relation Age of Onset  . Aneurysm Mother   . Dementia Father   . Dystonia Sister     ROS:  Please see the history of present illness.  All other systems reviewed and negative.   PHYSICAL EXAM: VS:  BP 110/70 mmHg  Pulse 80  Ht 5' 9" (1.753 m)  Wt 207 lb (93.895 kg)  BMI 30.55 kg/m2 obese, well developed, in no acute distress HEENT: Pupils are equal round react to light accommodation extraocular movements are intact.  Neck: no JVDNo cervical lymphadenopathy. Cardiac: Regular rate and rhythm without murmurs rubs or gallops. Lungs:  clear to auscultation bilaterally, no wheezing, rhonchi or rales Abd: soft, nontender, positive bowel sounds all quadrants, no hepatosplenomegaly Ext: no lower extremity edema.  2+ radial and dorsalis pedis pulses. Skin: warm and dry Neuro:  Grossly normal  EKG: sinus rhythm first degree AV block, rate 70 bpm  Lipid Panel     Component Value Date/Time   CHOL 116* 12/24/2015 0841   TRIG 101 12/24/2015 0841   HDL 42 12/24/2015 0841   CHOLHDL 2.8 12/24/2015 0841   VLDL 20 12/24/2015 0841   LDLCALC 54 12/24/2015 0841     ASSESSMENT AND PLAN:  Problem List Items Addressed This Visit    Hyperlipidemia   Relevant Medications   atorvastatin (LIPITOR) 40 MG tablet   Chronic combined systolic and diastolic heart failure, NYHA class 1 (HCC) - Primary   Relevant Medications   atorvastatin (LIPITOR) 40 MG tablet   Chest pain   Cardiomyopathy, ischemic   Relevant Medications   atorvastatin  (LIPITOR) 40 MG tablet   CAD S/P percutaneous coronary angioplasty (Chronic)   Relevant Medications   atorvastatin (LIPITOR) 40 MG tablet    Other Visit Diagnoses    Chest tightness        Relevant Orders    EKG 12-Lead    Myocardial Perfusion Imaging       Mr. Dubree symptoms are little vague in how he describes them.  He seemed to have nitroglycerin responsive chest pain over the weekend.  He also reported having chest pain during cardiac rehabilitation recently, which is why he is here today.  I have ordered a treadmill Cardiolite stress test.  Patient's wife is very concerned about him being on a statin. The last time he was on a   statin she noticed obvious memory loss issues and wants the statin was discontinued resolved. He was started on 80 mg of Lipitor after his STEMI in December.his most recent lipid panel was 01/05/2016 LDL was 54. Decreased his Lipitor to 40 mg.currently appears euvolemic on exam. Blood pressure is controlled.   

## 2016-02-17 ENCOUNTER — Telehealth: Payer: Self-pay | Admitting: Physician Assistant

## 2016-02-17 ENCOUNTER — Encounter (HOSPITAL_COMMUNITY): Admission: RE | Admit: 2016-02-17 | Payer: BLUE CROSS/BLUE SHIELD | Source: Ambulatory Visit

## 2016-02-17 NOTE — Telephone Encounter (Signed)
[facility]. Pt presented for rehab session today but she noted he was seen yesterday in office for exertional chest pain workup. Wanted to know if pt should exercise or refrain from activity today. Sought clarification from Wilburt FinlayBryan Hager who evaluated in office. He had discussed w/ patient & clarified that patient should not exercise until stress test rules out cardiac origin for complaint. This was [facility] to [facility] who will inform patient. He follows up for his stress test on 4/11.

## 2016-02-18 ENCOUNTER — Telehealth (HOSPITAL_COMMUNITY): Payer: Self-pay

## 2016-02-18 NOTE — Telephone Encounter (Signed)
Encounter complete. 

## 2016-02-19 ENCOUNTER — Encounter (HOSPITAL_COMMUNITY): Payer: BLUE CROSS/BLUE SHIELD

## 2016-02-22 ENCOUNTER — Encounter (HOSPITAL_COMMUNITY): Payer: BLUE CROSS/BLUE SHIELD

## 2016-02-23 ENCOUNTER — Ambulatory Visit (HOSPITAL_COMMUNITY)
Admission: RE | Admit: 2016-02-23 | Discharge: 2016-02-23 | Disposition: A | Discharge status: Home / Self Care | Payer: BLUE CROSS/BLUE SHIELD | Source: Ambulatory Visit | Attending: Cardiology | Admitting: Cardiology

## 2016-02-23 ENCOUNTER — Encounter (HOSPITAL_COMMUNITY): Payer: Self-pay | Admitting: *Deleted

## 2016-02-23 DIAGNOSIS — Z6830 Body mass index (BMI) 30.0-30.9, adult: Secondary | ICD-10-CM | POA: Diagnosis not present

## 2016-02-23 DIAGNOSIS — R0609 Other forms of dyspnea: Secondary | ICD-10-CM | POA: Diagnosis not present

## 2016-02-23 DIAGNOSIS — R5383 Other fatigue: Secondary | ICD-10-CM | POA: Diagnosis not present

## 2016-02-23 DIAGNOSIS — R002 Palpitations: Secondary | ICD-10-CM | POA: Diagnosis not present

## 2016-02-23 DIAGNOSIS — R9439 Abnormal result of other cardiovascular function study: Secondary | ICD-10-CM | POA: Diagnosis not present

## 2016-02-23 DIAGNOSIS — E663 Overweight: Secondary | ICD-10-CM | POA: Diagnosis not present

## 2016-02-23 DIAGNOSIS — R0789 Other chest pain: Secondary | ICD-10-CM | POA: Insufficient documentation

## 2016-02-23 DIAGNOSIS — Z87891 Personal history of nicotine dependence: Secondary | ICD-10-CM | POA: Insufficient documentation

## 2016-02-23 DIAGNOSIS — I517 Cardiomegaly: Secondary | ICD-10-CM | POA: Diagnosis not present

## 2016-02-23 LAB — MYOCARDIAL PERFUSION IMAGING
CHL CUP MPHR: 156 {beats}/min
CHL CUP NUCLEAR SDS: 10
CHL CUP NUCLEAR SSS: 20
CSEPHR: 87 %
CSEPPHR: 136 {beats}/min
Estimated workload: 7 METS
Exercise duration (min): 5 min
LVDIAVOL: 93 mL (ref 62–150)
LVSYSVOL: 50 mL
RPE: 15
Rest HR: 84 {beats}/min
SRS: 10
TID: 1.15

## 2016-02-23 MED ORDER — TECHNETIUM TC 99M SESTAMIBI GENERIC - CARDIOLITE
31.0000 | Freq: Once | INTRAVENOUS | Status: AC | PRN
  Administered 2016-02-23: 31 via INTRAVENOUS

## 2016-02-23 MED ORDER — TECHNETIUM TC 99M SESTAMIBI GENERIC - CARDIOLITE
10.7000 | Freq: Once | INTRAVENOUS | Status: AC | PRN
  Administered 2016-02-23: 10.7 via INTRAVENOUS

## 2016-02-23 NOTE — Progress Notes (Signed)
Dr Rennis GoldenHilty reviewed Cardiolite study. Pt to be called to make a follow-up appt to discuss results of test.Roger [name], [name]

## 2016-02-24 ENCOUNTER — Encounter (HOSPITAL_COMMUNITY)
Admission: RE | Admit: 2016-02-24 | Discharge: 2016-02-24 | Disposition: A | Discharge status: Home / Self Care | Payer: BLUE CROSS/BLUE SHIELD | Source: Ambulatory Visit | Attending: Cardiovascular Disease | Admitting: Cardiovascular Disease

## 2016-02-24 ENCOUNTER — Telehealth (HOSPITAL_COMMUNITY): Payer: Self-pay | Admitting: *Deleted

## 2016-02-24 NOTE — Progress Notes (Signed)
Pt arrived to cardiac rehab for exercise. Pt advised that he would not be able to exercise today.  Advised pt that notification was received by Wilburt FinlayBryan Hager for pt to hold on returning to exercise until further testing could be completed.  Pt had stress test and per Judie GrieveBryan hager Pa notation "high risk". Pt upset about not being told prior to this and being labeled as "no show no call" for prior absences when he was instructed to hold on exercise until the stress test was completed.  Apology given to pt and explained that Bryan's in basket message was not received until this morning. Pt verbalized understanding. Alanson Alyarlette Carlton RN, BSN

## 2016-02-24 NOTE — Telephone Encounter (Signed)
-----   Message from Dwana MelenaBryan W Hager, PA-C sent at 02/23/2016  5:00 PM EDT ----- Regarding: RE: off an on Chest tigntness on exertion - sees you on 4/4 at 8 am Niel Peretti,  [name] stress test was High Risk.  No CR until we do further testing.  Wilburt FinlayHAGER, BRYAN PAC  ----- Message -----    From: Chelsea Ausarlette B Aryon Nham, RN    Sent: 02/15/2016   9:14 AM      To: Dwana MelenaBryan W Hager, PA-C Subject: off an on Chest tigntness on exertion - sees#  Arlys JohnBrian,  Pt is a participant in CR s/p stemi. Arrest, stent placement 10/2015. Allyson Sabal(Berry)  Pt completed a 6 min walk test today for post evaluation - pt will graduate in two weeks.  At the completion of the walk test, pt reported after the fact that he had chest tightness after the third or fourth lap.  Pain resolved at the completion with no further reoccurrence during post measurements.  Monitor strip remained unchanged  Pt then disclosed that he took ntg over the weekend for chest pressure that was non exertional.  Pt further disclosed that he has periodically had some chest tightness here at cardiac rehab "off and on" not reported at the time of the symptoms.  I am giving him a copy of his rehab report to give to you for review during this appointment tomorrow.  Pt wife will accompany him to the appt - I am unsure without her he would fully disclose what is really going on he was very sheepish with me when I questioned him. After your assessment, please advise on how best to proceed.  Thanks PepsiCoCarlette Kuuipo Anzaldo RN

## 2016-02-25 ENCOUNTER — Encounter: Payer: Self-pay | Admitting: Physician Assistant

## 2016-02-26 ENCOUNTER — Encounter (HOSPITAL_COMMUNITY): Payer: BLUE CROSS/BLUE SHIELD

## 2016-02-26 ENCOUNTER — Telehealth: Payer: Self-pay | Admitting: Physician Assistant

## 2016-02-26 DIAGNOSIS — R9439 Abnormal result of other cardiovascular function study: Secondary | ICD-10-CM

## 2016-02-26 DIAGNOSIS — Z01810 Encounter for preprocedural cardiovascular examination: Secondary | ICD-10-CM

## 2016-02-26 NOTE — Telephone Encounter (Signed)
Randa EvensJoanne called in wanting to know what Bryan's recommendations are for the pt since his stress test came back positive. Please f/u with her  Thanks

## 2016-02-26 NOTE — Telephone Encounter (Signed)
Forwarded to Wilburt FinlayBryan Hager, PA.

## 2016-02-29 ENCOUNTER — Encounter: Payer: Self-pay | Admitting: Cardiovascular Disease

## 2016-02-29 ENCOUNTER — Other Ambulatory Visit: Payer: Self-pay | Admitting: *Deleted

## 2016-02-29 ENCOUNTER — Telehealth: Payer: Self-pay | Admitting: Physician Assistant

## 2016-02-29 ENCOUNTER — Encounter (HOSPITAL_COMMUNITY): Payer: BLUE CROSS/BLUE SHIELD

## 2016-02-29 DIAGNOSIS — Z01812 Encounter for preprocedural laboratory examination: Secondary | ICD-10-CM

## 2016-02-29 NOTE — Telephone Encounter (Signed)
Pt said he needs to have cath,Bryan Hager called and he missed his call this morning. If at lf possible, he would like his Cath after May 1st,pt will be on vacation next week.

## 2016-02-29 NOTE — Telephone Encounter (Signed)
Left heart cath preprocedure orders in, msg sent to scheduler pool to contact pt.

## 2016-02-29 NOTE — Telephone Encounter (Signed)
Message sent this AM to scheduling for arrangement of cath.

## 2016-03-01 LAB — CBC
HEMATOCRIT: 42 % (ref 38.5–50.0)
Hemoglobin: 14.2 g/dL (ref 13.2–17.1)
MCH: 31.3 pg (ref 27.0–33.0)
MCHC: 33.8 g/dL (ref 32.0–36.0)
MCV: 92.7 fL (ref 80.0–100.0)
MPV: 9.5 fL (ref 7.5–12.5)
Platelets: 243 10*3/uL (ref 140–400)
RBC: 4.53 MIL/uL (ref 4.20–5.80)
RDW: 13.6 % (ref 11.0–15.0)
WBC: 8.3 10*3/uL (ref 3.8–10.8)

## 2016-03-01 LAB — BASIC METABOLIC PANEL
BUN: 13 mg/dL (ref 7–25)
CHLORIDE: 106 mmol/L (ref 98–110)
CO2: 24 mmol/L (ref 20–31)
Calcium: 9.2 mg/dL (ref 8.6–10.3)
Creat: 1.07 mg/dL (ref 0.70–1.25)
Glucose, Bld: 110 mg/dL — ABNORMAL HIGH (ref 65–99)
POTASSIUM: 4.4 mmol/L (ref 3.5–5.3)
Sodium: 141 mmol/L (ref 135–146)

## 2016-03-01 LAB — PROTIME-INR
INR: 0.9 (ref <1.50)
PROTHROMBIN TIME: 12.2 s (ref 11.6–15.2)

## 2016-03-01 LAB — APTT: aPTT: 27 seconds (ref 24–37)

## 2016-03-01 LAB — TSH: TSH: 1.2 mIU/L (ref 0.40–4.50)

## 2016-03-02 ENCOUNTER — Encounter (HOSPITAL_COMMUNITY)
Admission: RE | Disposition: A | Discharge status: Home / Self Care | Payer: Self-pay | Source: Ambulatory Visit | Attending: Cardiovascular Disease

## 2016-03-02 ENCOUNTER — Encounter (HOSPITAL_COMMUNITY): Payer: BLUE CROSS/BLUE SHIELD

## 2016-03-02 ENCOUNTER — Encounter (HOSPITAL_COMMUNITY): Payer: Self-pay | Admitting: General Practice

## 2016-03-02 ENCOUNTER — Ambulatory Visit (HOSPITAL_COMMUNITY)
Admission: RE | Admit: 2016-03-02 | Discharge: 2016-03-03 | Disposition: A | Discharge status: Home / Self Care | Payer: BLUE CROSS/BLUE SHIELD | Source: Ambulatory Visit | Attending: Cardiovascular Disease | Admitting: Cardiovascular Disease

## 2016-03-02 DIAGNOSIS — Z7902 Long term (current) use of antithrombotics/antiplatelets: Secondary | ICD-10-CM | POA: Diagnosis not present

## 2016-03-02 DIAGNOSIS — Y831 Surgical operation with implant of artificial internal device as the cause of abnormal reaction of the patient, or of later complication, without mention of misadventure at the time of the procedure: Secondary | ICD-10-CM | POA: Diagnosis not present

## 2016-03-02 DIAGNOSIS — I2510 Atherosclerotic heart disease of native coronary artery without angina pectoris: Secondary | ICD-10-CM

## 2016-03-02 DIAGNOSIS — Z01812 Encounter for preprocedural laboratory examination: Secondary | ICD-10-CM

## 2016-03-02 DIAGNOSIS — E785 Hyperlipidemia, unspecified: Secondary | ICD-10-CM | POA: Insufficient documentation

## 2016-03-02 DIAGNOSIS — I25110 Atherosclerotic heart disease of native coronary artery with unstable angina pectoris: Secondary | ICD-10-CM | POA: Diagnosis not present

## 2016-03-02 DIAGNOSIS — Z79899 Other long term (current) drug therapy: Secondary | ICD-10-CM | POA: Diagnosis not present

## 2016-03-02 DIAGNOSIS — Z720 Tobacco use: Secondary | ICD-10-CM | POA: Diagnosis present

## 2016-03-02 DIAGNOSIS — Z87891 Personal history of nicotine dependence: Secondary | ICD-10-CM | POA: Insufficient documentation

## 2016-03-02 DIAGNOSIS — T82855A Stenosis of coronary artery stent, initial encounter: Secondary | ICD-10-CM | POA: Insufficient documentation

## 2016-03-02 DIAGNOSIS — I5042 Chronic combined systolic (congestive) and diastolic (congestive) heart failure: Secondary | ICD-10-CM | POA: Diagnosis not present

## 2016-03-02 DIAGNOSIS — Z7982 Long term (current) use of aspirin: Secondary | ICD-10-CM | POA: Insufficient documentation

## 2016-03-02 DIAGNOSIS — I255 Ischemic cardiomyopathy: Secondary | ICD-10-CM | POA: Diagnosis not present

## 2016-03-02 DIAGNOSIS — I200 Unstable angina: Secondary | ICD-10-CM | POA: Diagnosis present

## 2016-03-02 DIAGNOSIS — R9439 Abnormal result of other cardiovascular function study: Secondary | ICD-10-CM | POA: Diagnosis present

## 2016-03-02 DIAGNOSIS — I252 Old myocardial infarction: Secondary | ICD-10-CM | POA: Diagnosis not present

## 2016-03-02 DIAGNOSIS — Z9861 Coronary angioplasty status: Secondary | ICD-10-CM

## 2016-03-02 HISTORY — DX: Low back pain, unspecified: M54.50

## 2016-03-02 HISTORY — DX: Peripheral vascular angioplasty status with implants and grafts: Z95.820

## 2016-03-02 HISTORY — DX: Angina pectoris, unspecified: I20.9

## 2016-03-02 HISTORY — DX: Unspecified osteoarthritis, unspecified site: M19.90

## 2016-03-02 HISTORY — PX: CORONARY ANGIOPLASTY: SHX604

## 2016-03-02 HISTORY — DX: Headache: R51

## 2016-03-02 HISTORY — PX: CARDIAC CATHETERIZATION: SHX172

## 2016-03-02 HISTORY — DX: Pneumonia, unspecified organism: J18.9

## 2016-03-02 HISTORY — DX: Headache, unspecified: R51.9

## 2016-03-02 HISTORY — DX: Low back pain: M54.5

## 2016-03-02 HISTORY — DX: Other chronic pain: G89.29

## 2016-03-02 LAB — POCT ACTIVATED CLOTTING TIME: Activated Clotting Time: 538 seconds

## 2016-03-02 SURGERY — LEFT HEART CATH AND CORONARY ANGIOGRAPHY

## 2016-03-02 MED ORDER — HEPARIN (PORCINE) IN NACL 2-0.9 UNIT/ML-% IJ SOLN
INTRAMUSCULAR | Status: AC
  Filled 2016-03-02: qty 1500

## 2016-03-02 MED ORDER — SODIUM CHLORIDE 0.9 % IV SOLN
INTRAVENOUS | Status: DC
  Administered 2016-03-02: 250 mL via INTRAVENOUS
  Administered 2016-03-02: 10:00:00 via INTRAVENOUS

## 2016-03-02 MED ORDER — LIDOCAINE HCL (PF) 1 % IJ SOLN
INTRAMUSCULAR | Status: DC | PRN
  Administered 2016-03-02: 2 mL via SUBCUTANEOUS

## 2016-03-02 MED ORDER — ONDANSETRON HCL 4 MG/2ML IJ SOLN: 4.0000 mg | Freq: Four times a day (QID) | INTRAMUSCULAR | Status: DC | PRN

## 2016-03-02 MED ORDER — SODIUM CHLORIDE 0.9 % IV SOLN
250.0000 mg | INTRAVENOUS | Status: DC | PRN
  Administered 2016-03-02: 1.75 mg/kg/h via INTRAVENOUS

## 2016-03-02 MED ORDER — VERAPAMIL HCL 2.5 MG/ML IV SOLN
INTRAVENOUS | Status: AC
  Filled 2016-03-02: qty 2

## 2016-03-02 MED ORDER — IOPAMIDOL (ISOVUE-370) INJECTION 76%
INTRAVENOUS | Status: AC
  Filled 2016-03-02: qty 100

## 2016-03-02 MED ORDER — METOPROLOL TARTRATE 25 MG PO TABS
25.0000 mg | ORAL_TABLET | Freq: Two times a day (BID) | ORAL | Status: DC
  Administered 2016-03-02 – 2016-03-03 (×2): 25 mg via ORAL
  Filled 2016-03-02 (×2): qty 1

## 2016-03-02 MED ORDER — SODIUM CHLORIDE 0.9 % IV SOLN
INTRAVENOUS | Status: DC
  Administered 2016-03-02: 16:00:00 via INTRAVENOUS

## 2016-03-02 MED ORDER — DIAZEPAM 5 MG PO TABS
5.0000 mg | ORAL_TABLET | ORAL | Status: AC
  Administered 2016-03-02: 5 mg via ORAL

## 2016-03-02 MED ORDER — SODIUM CHLORIDE 0.9% FLUSH
3.0000 mL | Freq: Two times a day (BID) | INTRAVENOUS | Status: DC
Start: 2016-03-02 — End: 2016-03-02

## 2016-03-02 MED ORDER — SODIUM CHLORIDE 0.9 % IV SOLN: 250.0000 mL | INTRAVENOUS | Status: DC | PRN

## 2016-03-02 MED ORDER — NITROGLYCERIN 1 MG/10 ML FOR IR/CATH LAB
INTRA_ARTERIAL | Status: AC
  Filled 2016-03-02: qty 10

## 2016-03-02 MED ORDER — ISOSORBIDE MONONITRATE ER 30 MG PO TB24
15.0000 mg | ORAL_TABLET | Freq: Every day | ORAL | Status: DC
  Administered 2016-03-02 – 2016-03-03 (×2): 15 mg via ORAL
  Filled 2016-03-02 (×2): qty 1

## 2016-03-02 MED ORDER — SODIUM CHLORIDE 0.9% FLUSH: 3.0000 mL | INTRAVENOUS | Status: DC | PRN

## 2016-03-02 MED ORDER — BIVALIRUDIN BOLUS VIA INFUSION - CUPID
INTRAVENOUS | Status: DC | PRN
  Administered 2016-03-02: 68.025 mg via INTRAVENOUS

## 2016-03-02 MED ORDER — ATORVASTATIN CALCIUM 40 MG PO TABS
40.0000 mg | ORAL_TABLET | Freq: Every day | ORAL | Status: DC
  Administered 2016-03-02: 19:00:00 40 mg via ORAL
  Filled 2016-03-02: qty 1

## 2016-03-02 MED ORDER — HEPARIN (PORCINE) IN NACL 2-0.9 UNIT/ML-% IJ SOLN
INTRAMUSCULAR | Status: DC | PRN
  Administered 2016-03-02: 1500 mL

## 2016-03-02 MED ORDER — HEPARIN SODIUM (PORCINE) 1000 UNIT/ML IJ SOLN
INTRAMUSCULAR | Status: DC | PRN
  Administered 2016-03-02: 4500 [IU] via INTRAVENOUS

## 2016-03-02 MED ORDER — MIDAZOLAM HCL 2 MG/2ML IJ SOLN
INTRAMUSCULAR | Status: AC
Start: 2016-03-02 — End: 2016-03-02
  Filled 2016-03-02: qty 2

## 2016-03-02 MED ORDER — VERAPAMIL HCL 2.5 MG/ML IV SOLN
INTRA_ARTERIAL | Status: DC | PRN
  Administered 2016-03-02: 15 mL via INTRA_ARTERIAL

## 2016-03-02 MED ORDER — PRASUGREL HCL 10 MG PO TABS
10.0000 mg | ORAL_TABLET | Freq: Every day | ORAL | Status: DC
  Administered 2016-03-03: 10:00:00 10 mg via ORAL
  Filled 2016-03-02: qty 1

## 2016-03-02 MED ORDER — MIDAZOLAM HCL 2 MG/2ML IJ SOLN
INTRAMUSCULAR | Status: AC
  Filled 2016-03-02: qty 2

## 2016-03-02 MED ORDER — BIVALIRUDIN 250 MG IV SOLR
INTRAVENOUS | Status: AC
  Filled 2016-03-02: qty 250

## 2016-03-02 MED ORDER — FENTANYL CITRATE (PF) 100 MCG/2ML IJ SOLN
INTRAMUSCULAR | Status: DC | PRN
  Administered 2016-03-02: 50 ug via INTRAVENOUS
  Administered 2016-03-02: 25 ug via INTRAVENOUS

## 2016-03-02 MED ORDER — LIDOCAINE HCL (PF) 1 % IJ SOLN
INTRAMUSCULAR | Status: AC
  Filled 2016-03-02: qty 30

## 2016-03-02 MED ORDER — DIAZEPAM 5 MG PO TABS
5.0000 mg | ORAL_TABLET | ORAL | Status: DC | PRN
  Administered 2016-03-02: 5 mg via ORAL
  Filled 2016-03-02: qty 1

## 2016-03-02 MED ORDER — ASPIRIN EC 81 MG PO TBEC
81.0000 mg | DELAYED_RELEASE_TABLET | Freq: Every day | ORAL | Status: DC
  Administered 2016-03-03: 81 mg via ORAL
  Filled 2016-03-02: qty 1

## 2016-03-02 MED ORDER — DIAZEPAM 5 MG PO TABS
ORAL_TABLET | ORAL | Status: AC
  Filled 2016-03-02: qty 1

## 2016-03-02 MED ORDER — ANGIOPLASTY BOOK
Freq: Once | Status: AC
  Administered 2016-03-02: 22:00:00
  Filled 2016-03-02: qty 1

## 2016-03-02 MED ORDER — HEPARIN SODIUM (PORCINE) 1000 UNIT/ML IJ SOLN
INTRAMUSCULAR | Status: AC
  Filled 2016-03-02: qty 1

## 2016-03-02 MED ORDER — NITROGLYCERIN 1 MG/10 ML FOR IR/CATH LAB
INTRA_ARTERIAL | Status: DC | PRN
  Administered 2016-03-02: 100 ug via INTRACORONARY
  Administered 2016-03-02: 200 ug via INTRA_ARTERIAL
  Administered 2016-03-02: 100 ug via INTRACORONARY

## 2016-03-02 MED ORDER — ACETAMINOPHEN 325 MG PO TABS
650.0000 mg | ORAL_TABLET | ORAL | Status: DC | PRN
  Administered 2016-03-03: 650 mg via ORAL
  Filled 2016-03-02: qty 2

## 2016-03-02 MED ORDER — FENTANYL CITRATE (PF) 100 MCG/2ML IJ SOLN
INTRAMUSCULAR | Status: AC
  Filled 2016-03-02: qty 2

## 2016-03-02 MED ORDER — IOPAMIDOL (ISOVUE-370) INJECTION 76%
INTRAVENOUS | Status: DC | PRN
  Administered 2016-03-02: 125 mL

## 2016-03-02 MED ORDER — SODIUM CHLORIDE 0.9% FLUSH
3.0000 mL | Freq: Two times a day (BID) | INTRAVENOUS | Status: DC
  Administered 2016-03-02: 19:00:00 3 mL via INTRAVENOUS

## 2016-03-02 MED ORDER — MIDAZOLAM HCL 2 MG/2ML IJ SOLN
INTRAMUSCULAR | Status: DC | PRN
  Administered 2016-03-02: 2 mg via INTRAVENOUS
  Administered 2016-03-02: 1 mg via INTRAVENOUS

## 2016-03-02 SURGICAL SUPPLY — 17 items
BALLN ANGIOSCULPT RX 3.0X15 (BALLOONS) ×2
BALLN ~~LOC~~ EUPHORA RX 3.25X15 (BALLOONS) ×2
BALLOON ANGIOSCULPT RX 3.0X15 (BALLOONS) ×1 IMPLANT
BALLOON ~~LOC~~ EUPHORA RX 3.25X15 (BALLOONS) ×1 IMPLANT
CATH INFINITI 5FR ANG PIGTAIL (CATHETERS) ×2 IMPLANT
CATH OPTITORQUE TIG 4.0 5F (CATHETERS) ×2 IMPLANT
CATH VISTA GUIDE 6FR XBLAD3.5 (CATHETERS) ×2 IMPLANT
DEVICE RAD COMP TR BAND LRG (VASCULAR PRODUCTS) ×2 IMPLANT
GLIDESHEATH SLEND SS 6F .021 (SHEATH) ×2 IMPLANT
KIT ENCORE 26 ADVANTAGE (KITS) ×2 IMPLANT
KIT HEART LEFT (KITS) ×2 IMPLANT
PACK CARDIAC CATHETERIZATION (CUSTOM PROCEDURE TRAY) ×2 IMPLANT
SYR MEDRAD MARK V 150ML (SYRINGE) ×2 IMPLANT
TRANSDUCER W/STOPCOCK (MISCELLANEOUS) ×2 IMPLANT
TUBING CIL FLEX 10 FLL-RA (TUBING) ×2 IMPLANT
WIRE COUGAR XT STRL 190CM (WIRE) ×2 IMPLANT
WIRE SAFE-T 1.5MM-J .035X260CM (WIRE) ×2 IMPLANT

## 2016-03-02 NOTE — Interval H&P Note (Signed)
Cath Lab Visit (complete for each Cath Lab visit)  Clinical Evaluation Leading to the Procedure:   ACS: No.  Non-ACS:    Anginal Classification: CCS III  Anti-ischemic medical therapy: Minimal Therapy (1 class of medications)  Non-Invasive Test Results: High-risk stress test findings: cardiac mortality >3%/year  Prior CABG: No previous CABG      History and Physical Interval Note:  03/02/2016 12:27 PM  Ardelle ParkJeffrey M Wojcicki  has presented today for surgery, with the diagnosis of abnormal stress test  The various methods of treatment have been discussed with the patient and family. After consideration of risks, benefits and other options for treatment, the patient has consented to  Procedure(s): Left Heart Cath and Coronary Angiography (N/A) as a surgical intervention .  The patient's history has been reviewed, patient examined, no change in status, stable for surgery.  I have reviewed the patient's chart and labs.  Questions were answered to the patient's satisfaction.     KELLY,THOMAS A

## 2016-03-02 NOTE — Progress Notes (Signed)
TR BAND REMOVAL  LOCATION:    right radial  DEFLATED PER PROTOCOL:    Yes.    TIME BAND OFF / DRESSING APPLIED:    1800   SITE UPON ARRIVAL:    Level 0  SITE AFTER BAND REMOVAL:    Level 0  CIRCULATION SENSATION AND MOVEMENT:    Within Normal Limits   Yes.    COMMENTS:   Tolerated procedure well 

## 2016-03-02 NOTE — H&P (View-Only) (Signed)
Patient ID: Roger [name], male   DOB: 1951-04-17, 65 y.o.   MRN: [id number]    Date:  02/16/2016   ID:  Roger [name], DOB 1951-04-17, MRN [id number]  PCP:  Maryelizabeth RowanEWEY,ELIZABETH, MD  Primary Cardiologist:  Allyson SabalBerry  Chief Complaint  Patient presents with  . Follow-up    tightness in chest with walking, no swelling or cramping, shortness of breath when tightness in chest     History of Present Illness: Roger ParkJeffrey M [name] is a 65 y.o. male with a history of anterior STEMI on 11/04/15. With direct intervention using a Promus struggling stent. He had noncritical disease otherwise. He did have ventricular fibrillation twice during the case requiring defibrillation but no CPR was performed. He was discharged home 4 days later. 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 A 2-D echo performed 11/11/15 revealed ejection fraction of 40-45% with anteroapical wall motion abnormality and mild to moderate posterior pericardial effusion. He saw Dr. Herbie BaltimoreHarding who began oral steroids. His symptoms resolved. He was a smoker prior to this and hasn't stopped smoking  The patient reports having difficulty catching his breath and chest tightness.  The tightness was severe enough that he took  NTG which resolved the feeling in 2-3 minutes.  It occurred at rest.  While at cardiac rehab during the 6 minutes walk the, "pt reported after the fact that he had chest tightness after the third or fourth lap. Pain resolved at the completion with no further reoccurrence during post measurements."  He reports no other symptoms.  He currently denies nausea, vomiting, fever, orthopnea, dizziness, PND, cough, congestion, abdominal pain, hematochezia, melena, lower extremity edema, claudication.  Wt Readings from Last 3 Encounters:  02/16/16 207 lb (93.895 kg)  12/03/15 201 lb 4.5 oz (91.3 kg)  12/01/15 202 lb (91.627 kg)     Past Medical History  Diagnosis  Date  . ST elevation (STEMI) myocardial infarction involving left anterior descending coronary artery (HCC) 11/04/2015    LAD 100%,   . CAD S/P percutaneous coronary angioplasty 11/04/2015    a. Ant STEMI 10/2015 - s/p DES to LAD , (Promus Premier DES 2.75 mm x 16 mm; 3.3 mm), residual ~50+% ulcerated RCA disease being treated medically.   Marland Kitchen. CAD (coronary artery disease)     a. Ant STEMI 10/2015 - s/p DES to LAD, residual RCA disease being treated medically.   . Sustained VT (ventricular tachycardia) (HCC)     a. Ischemic -- Had VT-VF during Cath for Anterior STEMI - Defib x 2  . Ischemic cardiomyopathy     a. 12/22 Echo: Post anterior STEMI EF 45-50%, followup echo 11/11/15 EF 40-45%.  . Chronic combined systolic and diastolic CHF (congestive heart failure) (HCC)     a. 2D echo 10/2015 at time of MI: EF 45-50%, grade 1 DD.  Marland Kitchen. Hyperglycemia     a. 10/2015 - A1C 6.5.  Marland Kitchen. 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 Tobacco abuse   . Hyperlipidemia with target LDL less than 70   . Kidney calculus   . Colon polyp     Current Outpatient Prescriptions  Medication Sig Dispense Refill  . aspirin EC 81 MG EC tablet Take 1 tablet (81 mg total) by mouth daily. 30 tablet 11  . atorvastatin (LIPITOR) 40 MG tablet Take 1 tablet (40 mg total) by mouth every evening. 30 tablet 6  . Cholecalciferol (VITAMIN D3) 1000  UNITS CAPS Take 1,000 Units by mouth daily as needed (takes occasionally).    . furosemide (LASIX) 20 MG tablet Take 1 tablet (20 mg total) by mouth daily. Take extra dose if needed 180 tablet 2  . metoprolol tartrate (LOPRESSOR) 25 MG tablet Take 1 tablet (25 mg total) by mouth 2 (two) times daily. 60 tablet 6  . nitroGLYCERIN (NITROSTAT) 0.4 MG SL tablet Place 1 tablet (0.4 mg total) under the tongue every 5 (five) minutes as needed for chest pain (up to 3 doses). 25 tablet 3  . potassium chloride (K-DUR,KLOR-CON) 10 MEQ tablet Take 1 tablet (10 mEq total) by mouth  daily. 30 tablet 6  . prasugrel (EFFIENT) 10 MG TABS tablet Take 1 tablet (10 mg total) by mouth daily. 30 tablet 11   No current facility-administered medications for this visit.    Allergies:   No Known Allergies  Social History:  The patient  reports that he quit smoking about 3 months ago. He has never used smokeless tobacco. He reports that he drinks alcohol. He reports that he does not use illicit drugs.   Family history:   Family History  Problem Relation Age of Onset  . Aneurysm Mother   . Dementia Father   . Dystonia Sister     ROS:  Please see the history of present illness.  All other systems reviewed and negative.   PHYSICAL EXAM: VS:  BP 110/70 mmHg  Pulse 80  Ht  (1.753 m)  Wt 207 lb (93.895 kg)  BMI 30.55 kg/m2 obese, well developed, in no acute distress HEENT: Pupils are equal round react to light accommodation extraocular movements are intact.  Neck: no JVDNo cervical lymphadenopathy. Cardiac: Regular rate and rhythm without murmurs rubs or gallops. Lungs:  clear to auscultation bilaterally, no wheezing, rhonchi or rales Abd: soft, nontender, positive bowel sounds all quadrants, no hepatosplenomegaly Ext: no lower extremity edema.  2+ radial and dorsalis pedis pulses. Skin: warm and dry Neuro:  Grossly normal  EKG: sinus rhythm first degree AV block, rate 70 bpm  Lipid Panel     Component Value Date/Time   CHOL 116* 12/24/2015 0841   TRIG 101 12/24/2015 0841   HDL 42 12/24/2015 0841   CHOLHDL 2.8 12/24/2015 0841   VLDL 20 12/24/2015 0841   LDLCALC 54 12/24/2015 0841     ASSESSMENT AND PLAN:  Problem List Items Addressed This Visit    Hyperlipidemia   Relevant Medications   atorvastatin (LIPITOR) 40 MG tablet   Chronic combined systolic and diastolic heart failure, NYHA class 1 (HCC) - Primary   Relevant Medications   atorvastatin (LIPITOR) 40 MG tablet   Chest pain   Cardiomyopathy, ischemic   Relevant Medications   atorvastatin  (LIPITOR) 40 MG tablet   CAD S/P percutaneous coronary angioplasty (Chronic)   Relevant Medications   atorvastatin (LIPITOR) 40 MG tablet    Other Visit Diagnoses    Chest tightness        Relevant Orders    EKG 12-Lead    Myocardial Perfusion Imaging       Roger [name] symptoms are little vague in how he describes them.  He seemed to have nitroglycerin responsive chest pain over the weekend.  He also reported having chest pain during cardiac rehabilitation recently, which is why he is here today.  I have ordered a treadmill Cardiolite stress test.  Patient's wife is very concerned about him being on a statin. The last time he was on a  statin she noticed obvious memory loss issues and wants the statin was discontinued resolved. He was started on 80 mg of Lipitor after his STEMI in December.his most recent lipid panel was 01/05/2016 LDL was 54. Decreased his Lipitor to 40 mg.currently appears euvolemic on exam. Blood pressure is controlled.

## 2016-03-03 ENCOUNTER — Encounter (HOSPITAL_COMMUNITY): Payer: Self-pay | Admitting: Cardiology

## 2016-03-03 DIAGNOSIS — I25110 Atherosclerotic heart disease of native coronary artery with unstable angina pectoris: Secondary | ICD-10-CM | POA: Diagnosis not present

## 2016-03-03 DIAGNOSIS — T82855A Stenosis of coronary artery stent, initial encounter: Secondary | ICD-10-CM | POA: Diagnosis not present

## 2016-03-03 DIAGNOSIS — I519 Heart disease, unspecified: Secondary | ICD-10-CM | POA: Diagnosis not present

## 2016-03-03 DIAGNOSIS — I200 Unstable angina: Secondary | ICD-10-CM

## 2016-03-03 DIAGNOSIS — E785 Hyperlipidemia, unspecified: Secondary | ICD-10-CM | POA: Diagnosis not present

## 2016-03-03 DIAGNOSIS — Z95820 Peripheral vascular angioplasty status with implants and grafts: Secondary | ICD-10-CM

## 2016-03-03 DIAGNOSIS — I255 Ischemic cardiomyopathy: Secondary | ICD-10-CM | POA: Diagnosis not present

## 2016-03-03 HISTORY — DX: Peripheral vascular angioplasty status with implants and grafts: Z95.820

## 2016-03-03 LAB — BASIC METABOLIC PANEL
ANION GAP: 8 (ref 5–15)
BUN: 11 mg/dL (ref 6–20)
CO2: 25 mmol/L (ref 22–32)
Calcium: 8.7 mg/dL — ABNORMAL LOW (ref 8.9–10.3)
Chloride: 109 mmol/L (ref 101–111)
Creatinine, Ser: 1.03 mg/dL (ref 0.61–1.24)
GFR calc Af Amer: >60 mL/min (ref >60)
GLUCOSE: 90 mg/dL (ref 65–99)
Potassium: 4.4 mmol/L (ref 3.5–5.1)
Sodium: 142 mmol/L (ref 135–145)

## 2016-03-03 LAB — CBC
HEMATOCRIT: 37.1 % — AB (ref 39.0–52.0)
HEMOGLOBIN: 12.4 g/dL — AB (ref 13.0–17.0)
MCH: 31.1 pg (ref 26.0–34.0)
MCHC: 33.4 g/dL (ref 30.0–36.0)
MCV: 93 fL (ref 78.0–100.0)
Platelets: 196 10*3/uL (ref 150–400)
RBC: 3.99 MIL/uL — ABNORMAL LOW (ref 4.22–5.81)
RDW: 13 % (ref 11.5–15.5)
WBC: 7.6 10*3/uL (ref 4.0–10.5)

## 2016-03-03 MED ORDER — ISOSORBIDE MONONITRATE ER 30 MG PO TB24: 15.0000 mg | ORAL_TABLET | Freq: Every day | ORAL | Status: DC

## 2016-03-03 NOTE — Discharge Instructions (Signed)
[facility] [address] at [phone] if any bleeding, swelling or drainage at cath site.  May shower, no tub baths for 48 hours for groin sticks. No lifting over 5 pounds for 3 days.  No Driving for 3 days  Continue Effient and asprin, do not stop.  Heart Healthy diet  Continue cardiac rehab.

## 2016-03-03 NOTE — Progress Notes (Signed)
CARDIAC REHAB PHASE I   PRE:  Rate/Rhythm: 79 SR  BP:  Sitting: 113/54        SaO2: 99 RA  MODE:  Ambulation: 1000 ft   POST:  Rate/Rhythm: 96 SR  BP:  Sitting: 111/69         SaO2: 98 RA  Pt ambulated 1000 ft on RA, independent, steady gait, tolerated well, no complaints. Completed PCI education with pt and wife at bedside.  Reviewed risk factors, PCI, anti-platelet therapy, activity restrictions, ntg, exercise, heart healthy diet, sodium restrictions, and phase 2 cardiac rehab. Pt verbalized understanding. Pt states he has quit smoking and has made significant changes to his diet and has been exercising regularly. Pt agrees to phase 2 cardiac rehab referral (pt currently in [facility]2), [facility] Pt to edge of bed per pt request after walk, call bell within reach. Will follow.  [phone] Joylene GrapesEmily C Lynton Crescenzo, RN, BSN 03/03/2016 9:35 AM

## 2016-03-03 NOTE — Progress Notes (Signed)
Pt LAC IV site bleeding, sore, pt requested IV to be removed.  Attempted a redress of iv site, site still oozing, could not be saved, iv removed, pt refused additional iv site.  Pt educated on importance of iv, pt still refused.

## 2016-03-03 NOTE — Progress Notes (Signed)
  Patient Name: Ardelle ParkJeffrey M Bonnet Date of Encounter: 03/03/2016  Active Problems:   Unstable angina (HCC)   Abnormal nuclear stress test   Length of Stay:   SUBJECTIVE  No angina overnight (presentation for restenosis was nocturnal angina). No problems at right radial access site.  CURRENT MEDS . aspirin EC  81 mg Oral Daily  . atorvastatin  40 mg Oral q1800  . isosorbide mononitrate  15 mg Oral Daily  . metoprolol tartrate  25 mg Oral BID  . prasugrel  10 mg Oral Daily  . sodium chloride flush  3 mL Intravenous Q12H    OBJECTIVE   Intake/Output Summary (Last 24 hours) at 03/03/16 0815 Last data filed at 03/03/16 0754  Gross per 24 hour  Intake 1480.83 ml  Output    600 ml  Net 880.83 ml   Filed Weights   03/02/16 0935 03/03/16 0442  Weight: 90.719 kg (200 lb) 91.3 kg (201 lb 4.5 oz)    PHYSICAL EXAM Filed Vitals:   03/02/16 1600 03/02/16 1941 03/03/16 0442 03/03/16 0755  BP: 106/66 108/42 110/59   Pulse: 78 82 83   Temp:  98 F (36.7 C) 98.1 F (36.7 C) 98 F (36.7 C)  TempSrc:  Oral Oral Oral  Resp:  17 20   Height:      Weight:   91.3 kg (201 lb 4.5 oz)   SpO2: 95% 97% 97%    General: Alert, oriented x3, no distress Head: no evidence of trauma, PERRL, EOMI, no exophtalmos or lid lag, no myxedema, no xanthelasma; normal ears, nose and oropharynx Neck: normal jugular venous pulsations and no hepatojugular reflux; brisk carotid pulses without delay and no carotid bruits Chest: clear to auscultation, no signs of consolidation by percussion or palpation, normal fremitus, symmetrical and full respiratory excursions Cardiovascular: normal position and quality of the apical impulse, regular rhythm, normal first and second heart sounds, no rubs or gallops, no murmur Abdomen: no tenderness or distention, no masses by palpation, no abnormal pulsatility or arterial bruits, normal bowel sounds, no hepatosplenomegaly Extremities: no clubbing, cyanosis or edema; 2+  radial, ulnar and brachial pulses bilaterally; 2+ right femoral, posterior tibial and dorsalis pedis pulses; 2+ left femoral, posterior tibial and dorsalis pedis pulses; no subclavian or femoral bruits Neurological: grossly nonfocal  LABS  CBC  Recent Labs  02/29/16 1554 03/03/16 0455  WBC 8.3 7.6  HGB 14.2 12.4*  HCT 42.0 37.1*  MCV 92.7 93.0  PLT 243 196   Basic Metabolic Panel  Recent Labs  02/29/16 1554  NA 141  K 4.4  CL 106  CO2 24  GLUCOSE 110*  BUN 13  CREATININE 1.07  CALCIUM 9.2     Recent Labs  02/29/16 1554  TSH 1.20   TELE NSR  ECG NSR, low voltage QRS, possible old septal MI  ASSESSMENT AND PLAN Successful POBA for LAD in stent restenosis. No post procedure complications, angina or CHF. DC home. F/U Dr. Allyson SabalBerry Reinforced need for compliance with DAPT. Return to cardiac rehab.   Thurmon FairMihai Eliza Grissinger, MD, [facility] [phone] office [phone] pager 03/03/2016 8:15 AM

## 2016-03-03 NOTE — Discharge Summary (Signed)
Physician Discharge Summary       Patient ID: Ardelle ParkJeffrey M Mesta MRN: [id number] DOB/AGE: [birth year] 65 y.o.  Admit date: 03/02/2016 Discharge date: 03/03/2016 Primary Cardiologist:Dr. Allyson SabalBerry   Discharge Diagnoses:  Principal Problem:   Unstable angina [facility](HCC) Active Problems:   Abnormal nuclear stress test   S/P angioplasty with stent 03/02/16 mLAD angioplasty    Hyperlipidemia   Tobacco abuse   CAD S/P percutaneous coronary angioplasty   Discharged Condition: good  Procedures: 03/02/16 by Dr. Tresa EndoKelly Procedures    Coronary Balloon Angioplasty   Left Heart Cath and Coronary Angiography    Conclusion     Mid RCA lesion, 50% stenosed.  1st Diag lesion, 55% stenosed.  Dist LAD lesion, 25% stenosed.  Mid LAD-1 lesion, 50% stenosed.  Mid LAD-2 lesion, 95% stenosed. Post intervention, there is a 0% residual stenosis. The lesion was previously treated with a stent (unknown type).  There is mild left ventricular systolic dysfunction.  Mild/moderate LV dysfunction with focal mid anterolateral hypocontractility and an ejection fraction of 4045%.  Two vessel coronary artery disease with 50-60% smooth narrowing in a moderate size very proximal first diagonal branch of the LAD, 50% proximal narrowing of the LAD after the first diagonal and before the first septal perforator artery and 95% in-stent restenosis in the previously placed Promus stent with 25% narrowing beyond the stented segment; normal left circumflex coronary artery; and large, dominant RCA with diffuse luminal irregularities and previously noted 40-50% mid RCA stenosis 20% distal narrowing.  Successful PCI to the LAD of the 95% in-stent restenosis treated with Angiosculpt scoring balloon and noncompliant balloon dilatation up to 3.25 mm with the 95% stenosis being reduced to 0%. There is brisk TIMI-3 flow. There is no change in the previously documented concomitant LAD narrowings.  RECOMMENDATION: The patient will  continue dual antiplatelet therapy with Effient and aspirin. There is a history of possible intolerance to Brilinta. Adjunctive medical therapy will be implemented with beta blocker and nitrates due to his concomitant CAD. Aggressive lipid-lowering is essential.     Hospital Course:   65 y.o. male with a history of anterior STEMI on 11/04/15. With direct intervention using a Promus struggling stent. He had noncritical disease otherwise. He did have ventricular fibrillation twice during the case requiring defibrillation but no CPR was performed. He was discharged home 4 days later. 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 A 2-D echo performed 11/11/15 revealed ejection fraction of 40-45% with anteroapical wall motion abnormality and mild to moderate posterior pericardial effusion. He saw Dr. Herbie BaltimoreHarding who began oral steroids. His symptoms resolved. He was a smoker prior to this and hasn't stopped smoking.    Pt was seen in office for SOB and chest tightness.  NTG with relief.  He underwent exercise myoview and EF was 46%, large defect of moderate severity + ischemia, high risk study.     He was then scheduled for cardiac cath. He presented 03/02/16 and rec'd  Mid LAD-2 lesion, 95% stenosed. Post intervention, there is a 0% residual stenosis. The lesion was previously treated with a stent (unknown type).                                                  There is mild left ventricular systolic dysfunction. EF 40-45%.  By today pt without complaints, no complications.  He was seen and evaluated by Dr. Royann Shivers and found stable for discharge.  He will follow up in office in 2 weeks.  He will continue with cardiac rehab.  Continue statin, BB, imdur was added to meds. He will continue Effient and ASA.  No ACE with EF > 40% though if BP stable on office visit this could be added.     Consults: None  Significant Diagnostic  Studies:  BMP Latest Ref Rng 03/03/2016 02/29/2016 11/08/2015  Glucose 65 - 99 mg/dL 90 960(A) 540(J)  BUN 6 - 20 mg/dL Creatinine 0.61 - 1.24 mg/dL 8.11 9.14 7.82  Sodium 135 - 145 mmol/L 142 141 135  Potassium 3.5 - 5.1 mmol/L 4.4 4.4 3.9  Chloride 101 - 111 mmol/L 109 106 103  CO2 22 - 32 mmol/L Calcium 8.9 - 10.3 mg/dL 9.5(A) 9.2 2.1(H)   CBC Latest Ref Rng 03/03/2016 02/29/2016 11/08/2015  WBC 4.0 - 10.5 K/uL 7.6 8.3 16.1(H)  Hemoglobin 13.0 - 17.0 g/dL 12.4(L) 14.2 13.8  Hematocrit 39.0 - 52.0 % 37.1(L) 42.0 41.0  Platelets 150 - 400 K/uL 196 243 222       Discharge Exam: Blood pressure 110/59, pulse 83, temperature 98 F (36.7 C), temperature source Oral, resp. rate 20, height  (1.778 m), weight 201 lb 4.5 oz (91.3 kg), SpO2 97 %.  Disposition: 01-Home or Self Care      Discharge Instructions    Amb Referral to Cardiac Rehabilitation    Complete by:  As directed   Diagnosis:  PTCA            Medication List    TAKE these medications        aspirin 81 MG EC tablet  Take 1 tablet (81 mg total) by mouth daily.     atorvastatin 40 MG tablet  Commonly known as:  LIPITOR  Take 1 tablet (40 mg total) by mouth every evening.     furosemide 20 MG tablet  Commonly known as:  LASIX  Take 1 tablet (20 mg total) by mouth daily. Take extra dose if needed     isosorbide mononitrate 30 MG 24 hr tablet  Commonly known as:  IMDUR  Take 0.5 tablets (15 mg total) by mouth daily.     metoprolol tartrate 25 MG tablet  Commonly known as:  LOPRESSOR  Take 1 tablet (25 mg total) by mouth 2 (two) times daily.     nitroGLYCERIN 0.4 MG SL tablet  Commonly known as:  NITROSTAT  Place 1 tablet (0.4 mg total) under the tongue every 5 (five) minutes as needed for chest pain (up to 3 doses).     potassium chloride 10 MEQ tablet  Commonly known as:  K-DUR,KLOR-CON  Take 1 tablet (10 mEq total) by mouth daily.     prasugrel 10 MG Tabs tablet  Commonly  known as:  EFFIENT  Take 1 tablet (10 mg total) by mouth daily.     Vitamin D3 1000 units Caps  Take 2,000 Units by mouth daily.       Follow-up Information    Follow up with HAGER, BRYAN, PA-C On 03/15/2016.   Specialties:  Physician Assistant, Radiology, Interventional Cardiology   Why:  10:30 AM Dr. Hazle Coca PA   Contact information:   [address] [place] [address] [phone]        Discharge Instructions: Call [facility]  [address] at [phone] if any bleeding, swelling or drainage at cath site.  May shower, no tub baths for 48 hours for groin sticks. No lifting over 5 pounds for 3 days.  No Driving for 3 days  Continue Effient and asprin, do not stop.  Heart Healthy diet  Continue cardiac rehab.    Signed: Leone Brand Nurse Practitioner-Certified [facility]: [facility] 03/03/2016, 3:16 PM  Time spent on discharge : 30 minutes.

## 2016-03-04 ENCOUNTER — Encounter (HOSPITAL_COMMUNITY): Payer: BLUE CROSS/BLUE SHIELD

## 2016-03-07 ENCOUNTER — Encounter (HOSPITAL_COMMUNITY): Payer: BLUE CROSS/BLUE SHIELD

## 2016-03-09 ENCOUNTER — Encounter (HOSPITAL_COMMUNITY): Payer: BLUE CROSS/BLUE SHIELD

## 2016-03-11 ENCOUNTER — Encounter (HOSPITAL_COMMUNITY): Payer: BLUE CROSS/BLUE SHIELD

## 2016-03-15 ENCOUNTER — Encounter: Payer: Self-pay | Admitting: Physician Assistant

## 2016-03-15 ENCOUNTER — Ambulatory Visit (INDEPENDENT_AMBULATORY_CARE_PROVIDER_SITE_OTHER): Payer: BLUE CROSS/BLUE SHIELD | Admitting: Physician Assistant

## 2016-03-15 VITALS — BP 120/78 | HR 76 | Ht 70.0 in | Wt 206.0 lb

## 2016-03-15 DIAGNOSIS — E785 Hyperlipidemia, unspecified: Secondary | ICD-10-CM | POA: Diagnosis not present

## 2016-03-15 DIAGNOSIS — Z9861 Coronary angioplasty status: Secondary | ICD-10-CM | POA: Diagnosis not present

## 2016-03-15 DIAGNOSIS — Z95820 Peripheral vascular angioplasty status with implants and grafts: Secondary | ICD-10-CM

## 2016-03-15 DIAGNOSIS — I2510 Atherosclerotic heart disease of native coronary artery without angina pectoris: Secondary | ICD-10-CM | POA: Diagnosis not present

## 2016-03-15 DIAGNOSIS — Z959 Presence of cardiac and vascular implant and graft, unspecified: Secondary | ICD-10-CM

## 2016-03-15 DIAGNOSIS — I255 Ischemic cardiomyopathy: Secondary | ICD-10-CM

## 2016-03-15 DIAGNOSIS — I5042 Chronic combined systolic (congestive) and diastolic (congestive) heart failure: Secondary | ICD-10-CM

## 2016-03-15 DIAGNOSIS — I4901 Ventricular fibrillation: Secondary | ICD-10-CM

## 2016-03-15 MED ORDER — ATORVASTATIN CALCIUM 20 MG PO TABS: 20.0000 mg | ORAL_TABLET | Freq: Every evening | ORAL | Status: DC

## 2016-03-15 MED ORDER — PRASUGREL HCL 10 MG PO TABS: 10.0000 mg | ORAL_TABLET | Freq: Every day | ORAL | Status: DC

## 2016-03-15 MED ORDER — TICAGRELOR 90 MG PO TABS: 90.0000 mg | ORAL_TABLET | Freq: Two times a day (BID) | ORAL | Status: DC

## 2016-03-15 MED ORDER — EZETIMIBE 10 MG PO TABS: 10.0000 mg | ORAL_TABLET | Freq: Every day | ORAL | Status: DC

## 2016-03-15 NOTE — Patient Instructions (Addendum)
Medication Instructions:  Your physician has recommended you make the following change in your medication:  1.  STOP the Effient 2.  DECREASE the Lipitor to 20 mg taking 1 tablet daily 3.  START the Brilinta 90 mg taking 1 tablet twice a day 4.  START Zetia 10 mg taking 1 tablet daily   Labwork: None ordered  Testing/Procedures: None ordered  Follow-Up: Your physician recommends that you schedule a follow-up appointment in: WITH DR. Allyson SabalBERRY IN July 2017   Any Other Special Instructions Will Be Listed Below (If Applicable).     If you need a refill on your cardiac medications before your next appointment, please call your pharmacy.

## 2016-03-15 NOTE — Progress Notes (Signed)
Patient ID: Roger ParkJeffrey M Batiz, male   DOB: [birth year], 65 y.o.   MRN: [id number]    Date:  03/15/2016   ID:  Roger ParkJeffrey M Bayne, DOB [birth year], MRN [id number]  PCP:  Maryelizabeth RowanEWEY,ELIZABETH, MD  Primary Cardiologist:  Allyson SabalBerry  Chief Complaint  Patient presents with  . Follow-up    no symptoms today     History of Present Illness: Roger [name] is a 65 y.o. male 65 y.o. male with a history of anterior STEMI on 11/04/15. With direct intervention using a Promus struggling stent. He had noncritical disease otherwise. He did have ventricular fibrillation twice during the case requiring defibrillation but no CPR was performed. He was discharged home 4 days later. 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 A 2-D echo performed 11/11/15 revealed ejection fraction of 40-45% with anteroapical wall motion abnormality and mild to moderate posterior pericardial effusion. He saw Dr. Herbie BaltimoreHarding who began oral steroids. His symptoms resolved. He was a smoker prior to this and hasn't stopped smoking.   Pt was seen in office for SOB and chest tightness. NTG with relief. He underwent exercise myoview and EF was 46%, large defect of moderate severity + ischemia, high risk study.    He was then scheduled for cardiac cath on 03/02/16 Which revealed 95% in-stent restenosis in the mid LAD. Post intervention, there is a 0% residual stenosis. The lesion was previously treated with a stent (unknown type).  There is mild left ventricular systolic dysfunction. EF 40-45%.   He will continue with cardiac rehab. Continue statin, BB, imdur was added to meds. No ACE with EF > 40%   Patient presents for posthospital follow-up. Reports doing well he was able [place] get [place] [employer] [place] and [place] time. Has been taking his Effient as prescribed. He did report that according to his wife, he has been more forgetful since being on the statin,  which is a problem for him in the past.   The patient currently denies nausea, vomiting, fever, chest pain, shortness of breath, orthopnea, dizziness, PND, cough, congestion, abdominal pain, hematochezia, melena, lower extremity edema, claudication.  Lipid Panel     Component Value Date/Time   CHOL 116* 12/24/2015 0841   TRIG 101 12/24/2015 0841   HDL 42 12/24/2015 0841   CHOLHDL 2.8 12/24/2015 0841   VLDL 20 12/24/2015 0841   LDLCALC 54 12/24/2015 0841     Wt Readings from Last 3 Encounters:  03/15/16 206 lb (93.441 kg)  03/03/16 201 lb 4.5 oz (91.3 kg)  02/23/16 207 lb (93.895 kg)     Past Medical History  Diagnosis Date  . CAD S/P percutaneous coronary angioplasty 11/04/2015    a. Ant STEMI 10/2015 - s/p DES to LAD , (Promus Premier DES 2.75 mm x 16 mm; 3.3 mm), residual ~50+% ulcerated RCA disease being treated medically.   Marland Kitchen. CAD (coronary artery disease)     a. Ant STEMI 10/2015 - s/p DES to LAD, residual RCA disease being treated medically.   . Sustained VT (ventricular tachycardia) (HCC)     a. Ischemic -- Had VT-VF during Cath for Anterior STEMI - Defib x 2  . Ischemic cardiomyopathy     a. 12/22 Echo: Post anterior STEMI EF 45-50%, followup echo 11/11/15 EF 40-45%.  . Chronic combined systolic and diastolic CHF (congestive heart failure) (HCC)     a. 2D echo 10/2015 at time of MI: EF 45-50%, grade 1  DD.  . Hyperglycemia     a. 10/2015 - A1C 6.5.  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 Tobacco abuse   . Hyperlipidemia with target LDL less than 70   . Colon polyp   . ST elevation (STEMI) myocardial infarction involving left anterior descending coronary artery (HCC) 11/04/2015    LAD 100%,   . Anginal pain (HCC)     "since 11/04/2015" (03/02/2016)  . Pneumonia 2016  . Headache     "sometimes daily; sometimes less often; no much now" (03/02/2016)  . Arthritis     "back" (03/02/2016)  . Chronic lower back pain   . Kidney calculus      "passed"  . S/P angioplasty with stent 03/02/16 mLAD angioplasty  03/03/2016    Current Outpatient Prescriptions  Medication Sig Dispense Refill  . aspirin EC 81 MG EC tablet Take 1 tablet (81 mg total) by mouth daily. 30 tablet 11  . atorvastatin (LIPITOR) 20 MG tablet Take 1 tablet (20 mg total) by mouth every evening. 90 tablet 3  . Cholecalciferol (VITAMIN D3) 1000 UNITS CAPS Take 2,000 Units by mouth daily.     . furosemide (LASIX) 20 MG tablet Take 1 tablet (20 mg total) by mouth daily. Take extra dose if needed 180 tablet 2  . isosorbide mononitrate (IMDUR) 30 MG 24 hr tablet Take 0.5 tablets (15 mg total) by mouth daily. 30 tablet 6  . metoprolol tartrate (LOPRESSOR) 25 MG tablet Take 1 tablet (25 mg total) by mouth 2 (two) times daily. 60 tablet 6  . nitroGLYCERIN (NITROSTAT) 0.4 MG SL tablet Place 1 tablet (0.4 mg total) under the tongue every 5 (five) minutes as needed for chest pain (up to 3 doses). 25 tablet 3  . potassium chloride (K-DUR,KLOR-CON) 10 MEQ tablet Take 1 tablet (10 mEq total) by mouth daily. 30 tablet 6  . ezetimibe (ZETIA) 10 MG tablet Take 1 tablet (10 mg total) by mouth daily. 90 tablet 3  . ticagrelor (BRILINTA) 90 MG TABS tablet Take 1 tablet (90 mg total) by mouth 2 (two) times daily. 180 tablet 3   No current facility-administered medications for this visit.    Allergies:   No Known Allergies  Social History:  The patient  reports that he quit smoking about 4 months ago. His smoking use included Cigarettes. He has a 50 pack-year smoking history. He has never used smokeless tobacco. He reports that he drinks about 1.8 oz of alcohol per week. He reports that he does not use illicit drugs.   Family history:   Family History  Problem Relation Age of Onset  . Aneurysm Mother   . Dementia Father   . Dystonia Sister     ROS:  Please see the history of present illness.  All other systems reviewed and negative.   PHYSICAL EXAM: VS:  BP 120/78 mmHg  Pulse 76   Ht  (1.778 m)  Wt 206 lb (93.441 kg)  BMI 29.56 kg/m2 Well nourished, well developed, in no acute distress HEENT: Pupils are equal round react to light accommodation extraocular movements are intact.  Neck: no JVDNo cervical lymphadenopathy. Cardiac: Regular rate and rhythm without murmurs rubs or gallops. Lungs:  clear to auscultation bilaterally, no wheezing, rhonchi or rales Abd: soft, nontender, positive bowel sounds all quadrants, no hepatosplenomegaly Ext: 1+ lower extremity edema.  2+ radial and dorsalis pedis pulses. Skin: warm and dry Neuro:  Grossly normal  EKG: Normal sinus  rhythm rate 76 bpm first-degree AV block.  ASSESSMENT AND PLAN:  Problem List Items Addressed This Visit    Ventricular fibrillation (HCC) - Primary   Relevant Medications   atorvastatin (LIPITOR) 20 MG tablet   ezetimibe (ZETIA) 10 MG tablet   Other Relevant Orders   EKG 12-Lead   S/P angioplasty with stent 03/02/16 mLAD angioplasty    Hyperlipidemia   Relevant Medications   atorvastatin (LIPITOR) 20 MG tablet   ezetimibe (ZETIA) 10 MG tablet   Chronic combined systolic and diastolic heart failure, NYHA class 1 (HCC)   Relevant Medications   atorvastatin (LIPITOR) 20 MG tablet   ezetimibe (ZETIA) 10 MG tablet   Cardiomyopathy, ischemic   Relevant Medications   atorvastatin (LIPITOR) 20 MG tablet   ezetimibe (ZETIA) 10 MG tablet   CAD S/P percutaneous coronary angioplasty (Chronic)   Relevant Medications   atorvastatin (LIPITOR) 20 MG tablet   ezetimibe (ZETIA) 10 MG tablet   Other Relevant Orders   EKG 12-Lead      Mr. Keltner presents for posthospital follow-up after having in-stent restenosis within previously placed mid LAD stent which was in December last year.  Moderate change in back to Brilinta see how well he tolerates it. He was having some shortness of breath in December during the initial hospitalization which is the reason he was changed to Effient.  Restenosis of think  we should try a different agent. His wife also reported that he's been more more forgetful since being on the statin his last LDL was 54. We'll try reducing his statin to 20 mg and adding Zetia 10 mg. Should recheck his lipids in about 43 months if not checked by his primary care provider. He is some mild lower from edema on exam but otherwise does not appear in heart failure. Continue Lasix 2920 mg and he can take an extra dose if he needs to. I told him to recheck his legs in the morning to see if there's any swelling. Phase II cardiac rehabilitation recommended.

## 2016-03-21 ENCOUNTER — Encounter: Payer: Self-pay | Admitting: Physician Assistant

## 2016-03-23 ENCOUNTER — Encounter (HOSPITAL_COMMUNITY)
Admission: RE | Admit: 2016-03-23 | Discharge: 2016-03-23 | Disposition: A | Discharge status: Home / Self Care | Payer: BLUE CROSS/BLUE SHIELD | Source: Ambulatory Visit | Attending: Cardiovascular Disease | Admitting: Cardiovascular Disease

## 2016-03-23 DIAGNOSIS — I213 ST elevation (STEMI) myocardial infarction of unspecified site: Secondary | ICD-10-CM | POA: Insufficient documentation

## 2016-03-23 DIAGNOSIS — Z955 Presence of coronary angioplasty implant and graft: Secondary | ICD-10-CM | POA: Diagnosis not present

## 2016-03-25 ENCOUNTER — Encounter (HOSPITAL_COMMUNITY)
Admission: RE | Admit: 2016-03-25 | Discharge: 2016-03-25 | Disposition: A | Discharge status: Home / Self Care | Payer: BLUE CROSS/BLUE SHIELD | Source: Ambulatory Visit | Attending: Cardiovascular Disease | Admitting: Cardiovascular Disease

## 2016-03-25 DIAGNOSIS — I213 ST elevation (STEMI) myocardial infarction of unspecified site: Secondary | ICD-10-CM | POA: Diagnosis not present

## 2016-03-28 ENCOUNTER — Encounter (HOSPITAL_COMMUNITY)
Admission: RE | Admit: 2016-03-28 | Discharge: 2016-03-28 | Disposition: A | Discharge status: Home / Self Care | Payer: BLUE CROSS/BLUE SHIELD | Source: Ambulatory Visit | Attending: Cardiovascular Disease | Admitting: Cardiovascular Disease

## 2016-03-28 DIAGNOSIS — I213 ST elevation (STEMI) myocardial infarction of unspecified site: Secondary | ICD-10-CM | POA: Diagnosis not present

## 2016-03-30 ENCOUNTER — Encounter (HOSPITAL_COMMUNITY)
Admission: RE | Admit: 2016-03-30 | Discharge: 2016-03-30 | Disposition: A | Discharge status: Home / Self Care | Payer: BLUE CROSS/BLUE SHIELD | Source: Ambulatory Visit | Attending: Cardiovascular Disease | Admitting: Cardiovascular Disease

## 2016-03-30 DIAGNOSIS — I213 ST elevation (STEMI) myocardial infarction of unspecified site: Secondary | ICD-10-CM | POA: Diagnosis not present

## 2016-04-01 ENCOUNTER — Encounter (HOSPITAL_COMMUNITY): Payer: Self-pay

## 2016-04-01 ENCOUNTER — Encounter (HOSPITAL_COMMUNITY)
Admission: RE | Admit: 2016-04-01 | Discharge: 2016-04-01 | Disposition: A | Discharge status: Home / Self Care | Payer: BLUE CROSS/BLUE SHIELD | Source: Ambulatory Visit | Attending: Cardiovascular Disease | Admitting: Cardiovascular Disease

## 2016-04-01 DIAGNOSIS — I213 ST elevation (STEMI) myocardial infarction of unspecified site: Secondary | ICD-10-CM | POA: Diagnosis not present

## 2016-04-01 NOTE — Progress Notes (Signed)
Pt graduated from cardiac rehab program today with completion of 36 exercise sessions in Phase II. Pt maintained good attendance and progressed nicely during his participation in rehab as evidenced by increased MET level.  Pt absences due to instent restonsis.  Pt symptoms have resolved with cardiac intervention for this disease progression.  Pt does c/o palpitations last night short term, self relieved, not associated with other symptoms,this is a new symptom for him.  Pt advised to notify Dr. Gwenlyn Found if these symptoms persist or worsen.  Medication list reconciled. Repeat  PHQ score- 0.  Pt continues to abstain from smoking.  He admits this has been difficult for him, but he verbalizes commitment.  Pt congratulated for this success.   Pt has made significant lifestyle changes and should be commended for his success. Pt feels he has achieved his goals during cardiac rehab.   Pt plans to continue exercise on his own walking.

## 2016-04-07 ENCOUNTER — Encounter: Payer: Self-pay | Admitting: Physician Assistant

## 2016-04-14 DIAGNOSIS — R55 Syncope and collapse: Secondary | ICD-10-CM

## 2016-04-14 HISTORY — DX: Syncope and collapse: R55

## 2016-04-21 ENCOUNTER — Emergency Department (HOSPITAL_COMMUNITY): Payer: BLUE CROSS/BLUE SHIELD

## 2016-04-21 ENCOUNTER — Observation Stay (HOSPITAL_COMMUNITY)
Admission: EM | Admit: 2016-04-21 | Discharge: 2016-04-22 | Disposition: A | Discharge status: Home / Self Care | Payer: BLUE CROSS/BLUE SHIELD | Attending: Internal Medicine | Admitting: Internal Medicine

## 2016-04-21 ENCOUNTER — Encounter (HOSPITAL_COMMUNITY): Payer: Self-pay | Admitting: Emergency Medicine

## 2016-04-21 DIAGNOSIS — R55 Syncope and collapse: Secondary | ICD-10-CM | POA: Diagnosis not present

## 2016-04-21 DIAGNOSIS — E860 Dehydration: Secondary | ICD-10-CM | POA: Insufficient documentation

## 2016-04-21 DIAGNOSIS — I5042 Chronic combined systolic (congestive) and diastolic (congestive) heart failure: Secondary | ICD-10-CM | POA: Diagnosis not present

## 2016-04-21 DIAGNOSIS — Z87891 Personal history of nicotine dependence: Secondary | ICD-10-CM | POA: Insufficient documentation

## 2016-04-21 DIAGNOSIS — I2510 Atherosclerotic heart disease of native coronary artery without angina pectoris: Secondary | ICD-10-CM | POA: Insufficient documentation

## 2016-04-21 DIAGNOSIS — Z955 Presence of coronary angioplasty implant and graft: Secondary | ICD-10-CM | POA: Diagnosis not present

## 2016-04-21 DIAGNOSIS — E785 Hyperlipidemia, unspecified: Secondary | ICD-10-CM | POA: Diagnosis present

## 2016-04-21 DIAGNOSIS — I252 Old myocardial infarction: Secondary | ICD-10-CM | POA: Insufficient documentation

## 2016-04-21 DIAGNOSIS — I255 Ischemic cardiomyopathy: Secondary | ICD-10-CM | POA: Insufficient documentation

## 2016-04-21 DIAGNOSIS — Z9861 Coronary angioplasty status: Secondary | ICD-10-CM

## 2016-04-21 HISTORY — DX: Syncope and collapse: R55

## 2016-04-21 LAB — I-STAT TROPONIN, ED: Troponin i, poc: 0 ng/mL (ref 0.00–0.08)

## 2016-04-21 LAB — CBC WITH DIFFERENTIAL/PLATELET
BASOS ABS: 0 10*3/uL (ref 0.0–0.1)
BASOS PCT: 0 %
Eosinophils Absolute: 0.2 10*3/uL (ref 0.0–0.7)
Eosinophils Relative: 2 %
HCT: 39.5 % (ref 39.0–52.0)
Hemoglobin: 13.1 g/dL (ref 13.0–17.0)
LYMPHS PCT: 28 %
Lymphs Abs: 2.3 10*3/uL (ref 0.7–4.0)
MCH: 30.6 pg (ref 26.0–34.0)
MCHC: 33.2 g/dL (ref 30.0–36.0)
MCV: 92.3 fL (ref 78.0–100.0)
MONO ABS: 0.8 10*3/uL (ref 0.1–1.0)
Monocytes Relative: 10 %
NEUTROS ABS: 4.8 10*3/uL (ref 1.7–7.7)
NEUTROS PCT: 60 %
PLATELETS: 210 10*3/uL (ref 150–400)
RBC: 4.28 MIL/uL (ref 4.22–5.81)
RDW: 12.8 % (ref 11.5–15.5)
WBC: 8.1 10*3/uL (ref 4.0–10.5)

## 2016-04-21 LAB — I-STAT CHEM 8, ED
BUN: 19 mg/dL (ref 6–20)
CREATININE: 1.2 mg/dL (ref 0.61–1.24)
Calcium, Ion: 1.17 mmol/L (ref 1.13–1.30)
Chloride: 102 mmol/L (ref 101–111)
Glucose, Bld: 120 mg/dL — ABNORMAL HIGH (ref 65–99)
HCT: 39 % (ref 39.0–52.0)
HEMOGLOBIN: 13.3 g/dL (ref 13.0–17.0)
POTASSIUM: 3.8 mmol/L (ref 3.5–5.1)
SODIUM: 140 mmol/L (ref 135–145)
TCO2: 24 mmol/L (ref 0–100)

## 2016-04-21 MED ORDER — NITROGLYCERIN 0.4 MG SL SUBL: 0.4000 mg | SUBLINGUAL_TABLET | SUBLINGUAL | Status: DC | PRN

## 2016-04-21 MED ORDER — EZETIMIBE 10 MG PO TABS
10.0000 mg | ORAL_TABLET | Freq: Every day | ORAL | Status: DC
  Administered 2016-04-22: 10 mg via ORAL
  Filled 2016-04-21: qty 1

## 2016-04-21 MED ORDER — ALBUTEROL SULFATE (2.5 MG/3ML) 0.083% IN NEBU: 2.5000 mg | INHALATION_SOLUTION | RESPIRATORY_TRACT | Status: DC | PRN

## 2016-04-21 MED ORDER — ASPIRIN EC 81 MG PO TBEC
81.0000 mg | DELAYED_RELEASE_TABLET | Freq: Every day | ORAL | Status: DC
  Administered 2016-04-22: 81 mg via ORAL
  Filled 2016-04-21: qty 1

## 2016-04-21 MED ORDER — ISOSORBIDE MONONITRATE ER 30 MG PO TB24
15.0000 mg | ORAL_TABLET | Freq: Every day | ORAL | Status: DC
  Administered 2016-04-22: 15 mg via ORAL
  Filled 2016-04-21 (×2): qty 1

## 2016-04-21 MED ORDER — FUROSEMIDE 20 MG PO TABS
20.0000 mg | ORAL_TABLET | Freq: Every day | ORAL | Status: DC
  Administered 2016-04-22: 20 mg via ORAL
  Filled 2016-04-21: qty 1

## 2016-04-21 MED ORDER — ENOXAPARIN SODIUM 40 MG/0.4ML ~~LOC~~ SOLN
40.0000 mg | SUBCUTANEOUS | Status: DC
  Filled 2016-04-21: qty 0.4

## 2016-04-21 MED ORDER — SODIUM CHLORIDE 0.9% FLUSH
3.0000 mL | Freq: Two times a day (BID) | INTRAVENOUS | Status: DC
  Administered 2016-04-22: 3 mL via INTRAVENOUS

## 2016-04-21 MED ORDER — ATORVASTATIN CALCIUM 10 MG PO TABS: 20.0000 mg | ORAL_TABLET | Freq: Every evening | ORAL | Status: DC

## 2016-04-21 MED ORDER — TICAGRELOR 90 MG PO TABS
90.0000 mg | ORAL_TABLET | Freq: Two times a day (BID) | ORAL | Status: DC
  Administered 2016-04-22: 90 mg via ORAL
  Filled 2016-04-21: qty 1

## 2016-04-21 MED ORDER — METOPROLOL TARTRATE 25 MG PO TABS
25.0000 mg | ORAL_TABLET | Freq: Two times a day (BID) | ORAL | Status: DC
Start: 2016-04-22 — End: 2016-04-22
  Administered 2016-04-22: 25 mg via ORAL
  Filled 2016-04-21: qty 1

## 2016-04-21 MED ORDER — VITAMIN D 1000 UNITS PO TABS
2000.0000 [IU] | ORAL_TABLET | Freq: Every day | ORAL | Status: DC
  Administered 2016-04-22: 2000 [IU] via ORAL
  Filled 2016-04-21: qty 2

## 2016-04-21 MED ORDER — POTASSIUM CHLORIDE CRYS ER 20 MEQ PO TBCR
10.0000 meq | EXTENDED_RELEASE_TABLET | Freq: Every day | ORAL | Status: DC
  Administered 2016-04-22: 10 meq via ORAL
  Filled 2016-04-21: qty 1

## 2016-04-21 MED ORDER — SODIUM CHLORIDE 0.9 % IV SOLN
INTRAVENOUS | Status: AC
  Administered 2016-04-22: 02:00:00 via INTRAVENOUS

## 2016-04-21 NOTE — H&P (Addendum)
History and Physical    Roger [name]:[id number] DOB: Jun 20, 1951 DOA: 04/21/2016  Referring MD/NP/PA: Dr. Rennis Chris PCP: Maryelizabeth Rowan, MD  Patient coming from: Friend's house via EMS  Chief Complaint: Syncope  HPI: Roger [name] is a 65 y.o. male with medical history significant of MI, VT, CAD s/p stent DES to LAD 12/16 with restenosis in 02/2016, combined CHF EF 40-45%, hyperlipidemia; who presents after having a syncopal event. Patient states they have been in his normal state of health. He had recently walked approximately a mile prior to going to a friend's house. He was sitting down at a table prepared to drink beer when he suddenly felt hot and a little nauseous. He got up and tried to use the bathroom at his friend's house and was sitting on the toilet. When he got up and tried to leave the bathroom he apparently only took a few steps prior to passing out. Patient's wife states that witnesses said that the patient hit his head on the door jamb as well as wash machine prior to falling to the floor. Patient was noted to be clammy, but was only out for less than a minute. Patient denied any chest pain, palpitations, shortness of breath, headache, changes in vision, or focal weakness. He only complains of associated symptoms of some mild chest discomfort when putting up that is reproducible. Patient reports eating and drinking like normal.  ED Course: Upon admission into the emergency department patient was evaluated and seen to be afebrile with all other vital signs within normal limits. Lab work filled CBC within normal limits, BUN of 19, creatinine of 1.2, glucose of 120, and a troponin of 0.0. EKG showed normal sinus rhythm with no signs of ischemia. Chest x-ray showed no acute abnormalities. [facility] called to admit.  Review of Systems: As per HPI otherwise 10 point review of systems negative.   Past Medical History  Diagnosis Date  . CAD S/P percutaneous coronary angioplasty  11/04/2015    a. Ant STEMI 10/2015 - s/p DES to LAD , (Promus Premier DES 2.75 mm x 16 mm; 3.3 mm), residual ~50+% ulcerated RCA disease being treated medically.   Marland Kitchen CAD (coronary artery disease)     a. Ant STEMI 10/2015 - s/p DES to LAD, residual RCA disease being treated medically.   . Sustained VT (ventricular tachycardia) (HCC)     a. Ischemic -- Had VT-VF during Cath for Anterior STEMI - Defib x 2  . Ischemic cardiomyopathy     a. 12/22 Echo: Post anterior STEMI EF 45-50%, followup echo 11/11/15 EF 40-45%.  . Chronic combined systolic and diastolic CHF (congestive heart failure) (HCC)     a. 2D echo 10/2015 at time of MI: EF 45-50%, grade 1 DD.  Marland Kitchen Hyperglycemia     a. 10/2015 - A1C 6.5.  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 Tobacco abuse   . Hyperlipidemia with target LDL less than 70   . Colon polyp   . ST elevation (STEMI) myocardial infarction involving left anterior descending coronary artery (HCC) 11/04/2015    LAD 100%,   . Anginal pain (HCC)     "since 11/04/2015" (03/02/2016)  . Pneumonia 2016  . Headache     "sometimes daily; sometimes less often; no much now" (03/02/2016)  . Arthritis     "back" (03/02/2016)  . Chronic lower back pain   . Kidney calculus     "passed"  . S/P angioplasty with stent  03/02/16 mLAD angioplasty  03/03/2016    Past Surgical History  Procedure Laterality Date  . Vasectomy    . Transthoracic echocardiogram  12/22 & 12/28 2016    a. Mild LVH, EF 45-50. Mid-apical-anteroseptal hypokinesis, grade 1 DD with high LVEDP and trace MR.; b. EF 40-45%, mid apical anteroseptal, anterior, apical akinesis. Moderate MR, small-moderate posterior pericardial effusion.  . Inguinal hernia repair Left   . Cardiac catheterization N/A 11/04/2015    Procedure: Left Heart Cath and Coronary Angiography;  Surgeon: Runell GessJonathan J Berry, MD;  [facility];  Service: Cardiovascular;  mLAD 50% then 100%, mRCA 50% ulcerted -- PCI  LAD  . Cardiac catheterization N/A 11/04/2015    Procedure: Coronary Stent Intervention;  Surgeon: Runell GessJonathan J Berry, MD;  [facility];  Service: Cardiovascular;  DES PCI to mLAD - Promus Premier 2.75 mm x 16 mm (3.3.mm)  . Coronary angioplasty  03/02/2016  . Cardiac catheterization N/A 03/02/2016    Procedure: Left Heart Cath and Coronary Angiography;  Surgeon: Lennette Biharihomas A Kelly, MD;  [facility];  Service: Cardiovascular;  Laterality: N/A;  . Cardiac catheterization N/A 03/02/2016    Procedure: Coronary Balloon Angioplasty;  Surgeon: Lennette Biharihomas A Kelly, MD;  Location: [facility] INVASIVE CV LAB;  Service: Cardiovascular;  Laterality: N/A;     reports that he quit smoking about 5 months ago. His smoking use included Cigarettes. He has a 50 pack-year smoking history. He has never used smokeless tobacco. He reports that he drinks about 1.8 oz of alcohol per week. He reports that he does not use illicit drugs.  No Known Allergies  Family History  Problem Relation Age of Onset  . Aneurysm Mother   . Dementia Father   . Dystonia Sister     Prior to Admission medications   Medication Sig Start Date End Date Taking? Authorizing Provider  aspirin EC 81 MG EC tablet Take 1 tablet (81 mg total) by mouth daily. 11/08/15  Yes Dayna N Dunn, PA-C  atorvastatin (LIPITOR) 20 MG tablet Take 1 tablet (20 mg total) by mouth every evening. 03/15/16  Yes Dwana MelenaBryan W Hager, PA-C  Cholecalciferol (VITAMIN D3) 1000 UNITS CAPS Take 2,000 Units by mouth daily.    Yes Historical Provider, MD  ezetimibe (ZETIA) 10 MG tablet Take 1 tablet (10 mg total) by mouth daily. 03/15/16  Yes Dwana MelenaBryan W Hager, PA-C  furosemide (LASIX) 20 MG tablet Take 1 tablet (20 mg total) by mouth daily. Take extra dose if needed 12/01/15  Yes Runell GessJonathan J Berry, MD  ibuprofen (ADVIL,MOTRIN) 200 MG tablet Take 200 mg by mouth every 6 (six) hours as needed for moderate pain.   Yes Historical Provider, MD  isosorbide mononitrate (IMDUR) 30  MG 24 hr tablet Take 0.5 tablets (15 mg total) by mouth daily. 03/03/16  Yes Leone BrandLaura R Ingold, NP  metoprolol tartrate (LOPRESSOR) 25 MG tablet Take 1 tablet (25 mg total) by mouth 2 (two) times daily. 11/08/15  Yes Dayna N Dunn, PA-C  nitroGLYCERIN (NITROSTAT) 0.4 MG SL tablet Place 1 tablet (0.4 mg total) under the tongue every 5 (five) minutes as needed for chest pain (up to 3 doses). 11/18/15  Yes Marykay Lexavid W Harding, MD  potassium chloride (K-DUR,KLOR-CON) 10 MEQ tablet Take 1 tablet (10 mEq total) by mouth daily. 11/08/15  Yes Dayna N Dunn, PA-C  ticagrelor (BRILINTA) 90 MG TABS tablet Take 1 tablet (90 mg total) by mouth 2 (two) times daily. 03/15/16  Yes Kelle DartingBryan W  Leron Croak, [place]    Physical Exam: Filed Vitals:   04/21/16 2145 04/21/16 2200 04/21/16 2215 04/21/16 2230  BP: 118/68 120/69 121/68 110/63  Pulse: 87 87 86 88  Temp:      TempSrc:      Resp: Height:      Weight:      SpO2: 97% 97% 96% 95%      Constitutional: NAD, calm, comfortable Filed Vitals:   04/21/16 2145 04/21/16 2200 04/21/16 2215 04/21/16 2230  BP: 118/68 120/69 121/68 110/63  Pulse: 87 87 86 88  Temp:      TempSrc:      Resp: Height:      Weight:      SpO2: 97% 97% 96% 95%   Eyes: PERRL, lids and conjunctivae normal ENMT: Mucous membranes are moist. Posterior pharynx clear of any exudate or lesions.Normal dentition.  Neck: normal, supple, no masses, no thyromegaly Respiratory: clear to auscultation bilaterally, no wheezing, no crackles. Normal respiratory effort. No accessory muscle use.  Cardiovascular: Regular rate and rhythm, no murmurs / rubs / gallops. Trace bilateral lower extremity edema. 2+ pedal pulses. No carotid bruits.  Abdomen: no tenderness, no masses palpated. No hepatosplenomegaly. Bowel sounds positive.  Musculoskeletal: no clubbing / cyanosis. No joint deformity upper and lower extremities. Good ROM, no contractures. Normal muscle tone.  Skin: no rashes, lesions, ulcers.  No induration Neurologic: CN 2-12 grossly intact. Sensation intact, DTR normal. Strength 5/5 in all 4.  Psychiatric: Normal judgment and insight. Alert and oriented x 3. Normal mood.     Labs on Admission: I have personally reviewed following labs and imaging studies  CBC:  Recent Labs Lab 04/21/16 2155 04/21/16 2204  WBC 8.1  --   NEUTROABS 4.8  --   HGB 13.1 13.3  HCT 39.5 39.0  MCV 92.3  --   PLT 210  --    Basic Metabolic Panel:  Recent Labs Lab 04/21/16 2204  NA 140  K 3.8  CL 102  GLUCOSE 120*  BUN 19  CREATININE 1.20   GFR: Estimated Creatinine Clearance: 69.5 mL/min (by C-G formula based on Cr of 1.2). Liver Function Tests: No results for input(s): AST, ALT, ALKPHOS, BILITOT, PROT, ALBUMIN in the last 168 hours. No results for input(s): LIPASE, AMYLASE in the last 168 hours. No results for input(s): AMMONIA in the last 168 hours. Coagulation Profile: No results for input(s): INR, PROTIME in the last 168 hours. Cardiac Enzymes: No results for input(s): CKTOTAL, CKMB, CKMBINDEX, TROPONINI in the last 168 hours. BNP (last 3 results) No results for input(s): PROBNP in the last 8760 hours. HbA1C: No results for input(s): HGBA1C in the last 72 hours. CBG: No results for input(s): GLUCAP in the last 168 hours. Lipid Profile: No results for input(s): CHOL, HDL, LDLCALC, TRIG, CHOLHDL, LDLDIRECT in the last 72 hours. Thyroid Function Tests: No results for input(s): TSH, T4TOTAL, FREET4, T3FREE, THYROIDAB in the last 72 hours. Anemia Panel: No results for input(s): VITAMINB12, FOLATE, FERRITIN, TIBC, IRON, RETICCTPCT in the last 72 hours. Urine analysis:    Component Value Date/Time   COLORURINE AMBER* 11/07/2015 1009   APPEARANCEUR CLEAR 11/07/2015 1009   LABSPEC 1.030 11/07/2015 1009   PHURINE 5.5 11/07/2015 1009   GLUCOSEU NEGATIVE 11/07/2015 1009   HGBUR NEGATIVE 11/07/2015 1009   BILIRUBINUR SMALL* 11/07/2015 1009   KETONESUR 15* 11/07/2015 1009    PROTEINUR 30* 11/07/2015 1009   UROBILINOGEN 0.2 04/01/2012 1057  NITRITE NEGATIVE 11/07/2015 1009   LEUKOCYTESUR NEGATIVE 11/07/2015 1009   Sepsis Labs: No results found for this or any previous visit (from the past 240 hour(s)).   Radiological Exams on Admission: No results found.  EKG: Independently reviewed. Sinus rhythm with low voltage in the precordial leads.   Assessment/Plan Syncope and collapse: On differential includes arrhythmia versus vasovagal secondary to dehydration  - Admit to telemetry - Check orthostatic vital signs on admission  - Check troponin in am - IVF NS at 75 ml x - Follow-up telemetry overnight - Would discuss patient's case with cardiology in a.m.  Combined CHF: Last EF noted to be 40-45%. Patient appears to be euvolemic at this time. - Continue  metoprolol, isosorbide mononitrate, Lasix.  Coronary artery disease s/p stent: In 02/2016 the patient underwent a cardiac cath in which he was noted to have 50% restenosis of the previous stent placed back in 10/2015 to the DES to LAD.  - Continue Brilinta and aspirin  Hyperlipidemia - Continue Zetia and atorvastatin  DVT prophylaxis: Lovenox   Code Status: Full Family Communication: Discussed plan with the patient's wife present at bedside Disposition Plan: Possible discharge tomorrow if workup negative  Consults called: none Admission status: Observation telemetry  Clydie Braun MD [facility] Pager [phone]  If 7PM-7AM, please contact night-coverage [email or web address] Password [facility]  04/21/2016, 10:46 PM

## 2016-04-21 NOTE — ED Notes (Addendum)
Ems states pt passed out earlier this evening. Pt did hit his head. Pt denies any pain at this time

## 2016-04-21 NOTE — ED Provider Notes (Signed)
CSN: [id number]     Arrival date & time 04/21/16  2033 History   First MD Initiated Contact with Patient 04/21/16 2058     Chief Complaint  Patient presents with  . Loss of Consciousness     (Consider location/radiation/quality/duration/timing/severity/associated sxs/prior Treatment) HPI Patient had syncopal event 7:30 PM today. He felt lightheaded for possibly 1 minute prior to the event. He states he was unconscious for probably 1 minute .Marland Kitchen Friends told him he was pale for 1-2 minutes after the event. He is presently asymptomatic. Denies any chest pain denies shortness of breath denies abdominal pain no other associated symptoms Past Medical History  Diagnosis Date  . CAD S/P percutaneous coronary angioplasty 11/04/2015    a. Ant STEMI 10/2015 - s/p DES to LAD , (Promus Premier DES 2.75 mm x 16 mm; 3.3 mm), residual ~50+% ulcerated RCA disease being treated medically.   Marland Kitchen CAD (coronary artery disease)     a. Ant STEMI 10/2015 - s/p DES to LAD, residual RCA disease being treated medically.   . Sustained VT (ventricular tachycardia) (HCC)     a. Ischemic -- Had VT-VF during Cath for Anterior STEMI - Defib x 2  . Ischemic cardiomyopathy     a. 12/22 Echo: Post anterior STEMI EF 45-50%, followup echo 11/11/15 EF 40-45%.  . Chronic combined systolic and diastolic CHF (congestive heart failure) (HCC)     a. 2D echo 10/2015 at time of MI: EF 45-50%, grade 1 DD.  Marland Kitchen Hyperglycemia     a. 10/2015 - A1C 6.5.  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 Tobacco abuse   . Hyperlipidemia with target LDL less than 70   . Colon polyp   . ST elevation (STEMI) myocardial infarction involving left anterior descending coronary artery (HCC) 11/04/2015    LAD 100%,   . Anginal pain (HCC)     "since 11/04/2015" (03/02/2016)  . Pneumonia 2016  . Headache     "sometimes daily; sometimes less often; no much now" (03/02/2016)  . Arthritis     "back" (03/02/2016)  . Chronic lower  back pain   . Kidney calculus     "passed"  . S/P angioplasty with stent 03/02/16 mLAD angioplasty  03/03/2016   Past Surgical History  Procedure Laterality Date  . Vasectomy    . Transthoracic echocardiogram  12/22 & 12/28 2016    a. Mild LVH, EF 45-50. Mid-apical-anteroseptal hypokinesis, grade 1 DD with high LVEDP and trace MR.; b. EF 40-45%, mid apical anteroseptal, anterior, apical akinesis. Moderate MR, small-moderate posterior pericardial effusion.  . Inguinal hernia repair Left   . Cardiac catheterization N/A 11/04/2015    Procedure: Left Heart Cath and Coronary Angiography;  Surgeon: Runell Gess, MD;  Location: [facility] INVASIVE CV LAB;  Service: Cardiovascular;  mLAD 50% then 100%, mRCA 50% ulcerted -- PCI LAD  . Cardiac catheterization N/A 11/04/2015    Procedure: Coronary Stent Intervention;  Surgeon: Runell Gess, MD;  Location: [facility] INVASIVE CV LAB;  Service: Cardiovascular;  DES PCI to mLAD - Promus Premier 2.75 mm x 16 mm (3.3.mm)  . Coronary angioplasty  03/02/2016  . Cardiac catheterization N/A 03/02/2016    Procedure: Left Heart Cath and Coronary Angiography;  Surgeon: Lennette Bihari, MD;  Location: [facility] INVASIVE CV LAB;  Service: Cardiovascular;  Laterality: N/A;  . Cardiac catheterization N/A 03/02/2016    Procedure: Coronary Balloon Angioplasty;  Surgeon: Lennette Bihari, MD;  Location: [facility] INVASIVE  CV LAB;  Service: Cardiovascular;  Laterality: N/A;   Family History  Problem Relation Age of Onset  . Aneurysm Mother   . Dementia Father   . Dystonia Sister    Social History  Substance Use Topics  . Smoking status: Former Smoker -- 1.00 packs/day for 50 years    Types: Cigarettes    Quit date: 11/04/2015  . Smokeless tobacco: Never Used  . Alcohol Use: 1.8 oz/week    0 Standard drinks or equivalent, 1 Glasses of wine, 1 Cans of beer, 1 Shots of liquor per week    Review of Systems  Constitutional: Negative.   HENT: Negative.   Respiratory: Negative.    Cardiovascular: Negative.   Gastrointestinal: Negative.   Musculoskeletal: Negative.   Skin: Negative.   Neurological: Positive for light-headedness.       Transient lightheadedness. Resolved  Psychiatric/Behavioral: Negative.   All other systems reviewed and are negative.     Allergies  Review of patient's allergies indicates no known allergies.  Home Medications   Prior to Admission medications   Medication Sig Start Date End Date Taking? Authorizing Provider  aspirin EC 81 MG EC tablet Take 1 tablet (81 mg total) by mouth daily. 11/08/15   Dayna N Dunn, PA-C  atorvastatin (LIPITOR) 20 MG tablet Take 1 tablet (20 mg total) by mouth every evening. 03/15/16   Dwana MelenaBryan W Hager, PA-C  Cholecalciferol (VITAMIN D3) 1000 UNITS CAPS Take 2,000 Units by mouth daily.     Historical Provider, MD  ezetimibe (ZETIA) 10 MG tablet Take 1 tablet (10 mg total) by mouth daily. 03/15/16   Dwana MelenaBryan W Hager, PA-C  furosemide (LASIX) 20 MG tablet Take 1 tablet (20 mg total) by mouth daily. Take extra dose if needed 12/01/15   Runell GessJonathan J Berry, MD  isosorbide mononitrate (IMDUR) 30 MG 24 hr tablet Take 0.5 tablets (15 mg total) by mouth daily. 03/03/16   Leone BrandLaura R Ingold, NP  metoprolol tartrate (LOPRESSOR) 25 MG tablet Take 1 tablet (25 mg total) by mouth 2 (two) times daily. 11/08/15   Dayna N Dunn, PA-C  nitroGLYCERIN (NITROSTAT) 0.4 MG SL tablet Place 1 tablet (0.4 mg total) under the tongue every 5 (five) minutes as needed for chest pain (up to 3 doses). 11/18/15   Marykay Lexavid W Harding, MD  potassium chloride (K-DUR,KLOR-CON) 10 MEQ tablet Take 1 tablet (10 mEq total) by mouth daily. 11/08/15   Dayna N Dunn, PA-C  ticagrelor (BRILINTA) 90 MG TABS tablet Take 1 tablet (90 mg total) by mouth 2 (two) times daily. 03/15/16   Dwana MelenaBryan W Hager, PA-C   BP 121/73 mmHg  Pulse 89  Temp(Src) 98 F (36.7 C) (Oral)  Resp 16  Ht 5\' 10"  (1.778 m)  Wt 194 lb (87.998 kg)  BMI 27.84 kg/m2  SpO2 97% Physical Exam  Constitutional: He  appears well-developed and well-nourished.  HENT:  Head: Normocephalic and atraumatic.  Eyes: Conjunctivae are normal. Pupils are equal, round, and reactive to light.  Neck: Neck supple. No tracheal deviation present. No thyromegaly present.  Cardiovascular: Normal rate and regular rhythm.   No murmur heard. Pulmonary/Chest: Effort normal and breath sounds normal.  Abdominal: Soft. Bowel sounds are normal. He exhibits no distension. There is no tenderness.  Musculoskeletal: Normal range of motion. He exhibits no edema or tenderness.  Neurological: He is alert. Coordination normal.  Skin: Skin is warm and dry. No rash noted.  Psychiatric: He has a normal mood and affect.  Nursing note and vitals reviewed.  ED Course  Procedures (including critical care time) Labs Review Labs Reviewed - No data to display  Imaging Review No results found. I have personally reviewed and evaluated these images and lab results as part of my medical decision-making.   EKG Interpretation   Date/Time:  Thursday April 21 2016 21:16:59 EDT Ventricular Rate:  90 PR Interval:  209 QRS Duration: 88 QT Interval:  340 QTC Calculation: 416 R Axis:   56 Text Interpretation:  Sinus rhythm Low voltage, extremity and precordial  leads No significant change since last tracing Confirmed by Lawton Dollinger   MD, Cheikh Bramble ([id number]) on 04/21/2016 9:43:09 PM     11:10 PM patient remains asymptomatic. Chest x-ray viewed by me, Results for orders placed or performed during the hospital encounter of 04/21/16  CBC with Differential/Platelet  Result Value Ref Range   WBC 8.1 4.0 - 10.5 K/uL   RBC 4.28 4.22 - 5.81 MIL/uL   Hemoglobin 13.1 13.0 - 17.0 g/dL   HCT 78.2 95.6 - 21.3 %   MCV 92.3 78.0 - 100.0 fL   MCH 30.6 26.0 - 34.0 pg   MCHC 33.2 30.0 - 36.0 g/dL   RDW 08.6 57.8 - 46.9 %   Platelets 210 150 - 400 K/uL   Neutrophils Relative % 60 %   Neutro Abs 4.8 1.7 - 7.7 K/uL   Lymphocytes Relative 28 %   Lymphs Abs 2.3  0.7 - 4.0 K/uL   Monocytes Relative 10 %   Monocytes Absolute 0.8 0.1 - 1.0 K/uL   Eosinophils Relative 2 %   Eosinophils Absolute 0.2 0.0 - 0.7 K/uL   Basophils Relative 0 %   Basophils Absolute 0.0 0.0 - 0.1 K/uL  I-stat chem 8, ed  Result Value Ref Range   Sodium 140 135 - 145 mmol/L   Potassium 3.8 3.5 - 5.1 mmol/L   Chloride 102 101 - 111 mmol/L   BUN 19 6 - 20 mg/dL   Creatinine, Ser 6.29 0.61 - 1.24 mg/dL   Glucose, Bld 528 (H) 65 - 99 mg/dL   Calcium, Ion 4.13 2.44 - 1.30 mmol/L   TCO2 24 0 - 100 mmol/L   Hemoglobin 13.3 13.0 - 17.0 g/dL   HCT 01.0 27.2 - 53.6 %  I-stat troponin, ED  Result Value Ref Range   Troponin i, poc 0.00 0.00 - 0.08 ng/mL   Comment 3           Dg Chest Port 1 View  04/21/2016  CLINICAL DATA:  65 year old male with syncope EXAM: PORTABLE CHEST 1 VIEW COMPARISON:  Radiograph dated 11/06/2015 FINDINGS: The heart size and mediastinal contours are within normal limits. Both lungs are clear. The visualized skeletal structures are unremarkable. IMPRESSION: No active disease. Electronically Signed   By: Elgie Collard M.D.   On: 04/21/2016 23:16   I called cardiology fellow in case he wished patient to be on his service. He declines to hospitalist service MDM  Concern for dysrhythmia Hospitalist Dr. Katrinka Blazing consulted plan 23 hour observation, telemetry Final diagnoses:  None   Diagnosis syncope     Doug Sou, MD 04/21/16 [phone]

## 2016-04-22 ENCOUNTER — Encounter (HOSPITAL_COMMUNITY): Payer: Self-pay | Admitting: General Practice

## 2016-04-22 ENCOUNTER — Observation Stay (HOSPITAL_BASED_OUTPATIENT_CLINIC_OR_DEPARTMENT_OTHER): Payer: BLUE CROSS/BLUE SHIELD

## 2016-04-22 DIAGNOSIS — I2510 Atherosclerotic heart disease of native coronary artery without angina pectoris: Secondary | ICD-10-CM | POA: Diagnosis not present

## 2016-04-22 DIAGNOSIS — R55 Syncope and collapse: Secondary | ICD-10-CM | POA: Diagnosis not present

## 2016-04-22 DIAGNOSIS — Z9861 Coronary angioplasty status: Secondary | ICD-10-CM | POA: Diagnosis not present

## 2016-04-22 DIAGNOSIS — E785 Hyperlipidemia, unspecified: Secondary | ICD-10-CM | POA: Diagnosis not present

## 2016-04-22 DIAGNOSIS — I5042 Chronic combined systolic (congestive) and diastolic (congestive) heart failure: Secondary | ICD-10-CM | POA: Diagnosis not present

## 2016-04-22 LAB — ECHOCARDIOGRAM COMPLETE
CHL CUP MV DEC (S): 201
EERAT: 8.46
EWDT: 201 ms
FS: 37 % (ref 28–44)
Height: 71 in
IVS/LV PW RATIO, ED: 0.93
LA ID, A-P, ES: 35 mm
LA diam end sys: 35 mm
LADIAMINDEX: 1.64 cm/m2
LAVOLA4C: 58 mL
LDCA: 4.15 cm2
LV E/e'average: 8.46
LV TDI E'LATERAL: 6.31
LV TDI E'MEDIAL: 6.42
LV e' LATERAL: 6.31 cm/s
LVEEMED: 8.46
LVOTD: 23 mm
MV pk A vel: 74.2 m/s
MV pk E vel: 53.4 m/s
PW: 11.1 mm — AB (ref 0.6–1.1)
Weight: 3150.4 oz

## 2016-04-22 LAB — URINALYSIS, ROUTINE W REFLEX MICROSCOPIC
BILIRUBIN URINE: NEGATIVE
Glucose, UA: NEGATIVE mg/dL
HGB URINE DIPSTICK: NEGATIVE
Ketones, ur: 15 mg/dL — AB
Leukocytes, UA: NEGATIVE
Nitrite: NEGATIVE
PH: 6.5 (ref 5.0–8.0)
Protein, ur: NEGATIVE mg/dL
SPECIFIC GRAVITY, URINE: 1.029 (ref 1.005–1.030)

## 2016-04-22 LAB — BASIC METABOLIC PANEL
Anion gap: 4 — ABNORMAL LOW (ref 5–15)
BUN: 16 mg/dL (ref 6–20)
CHLORIDE: 109 mmol/L (ref 101–111)
CO2: 26 mmol/L (ref 22–32)
CREATININE: 1.02 mg/dL (ref 0.61–1.24)
Calcium: 8.5 mg/dL — ABNORMAL LOW (ref 8.9–10.3)
GFR calc Af Amer: >60 mL/min (ref >60)
GFR calc non Af Amer: >60 mL/min (ref >60)
GLUCOSE: 117 mg/dL — AB (ref 65–99)
POTASSIUM: 3.7 mmol/L (ref 3.5–5.1)
Sodium: 139 mmol/L (ref 135–145)

## 2016-04-22 LAB — CBC
HCT: 38 % — ABNORMAL LOW (ref 39.0–52.0)
HEMOGLOBIN: 12.5 g/dL — AB (ref 13.0–17.0)
MCH: 30.1 pg (ref 26.0–34.0)
MCHC: 32.9 g/dL (ref 30.0–36.0)
MCV: 91.6 fL (ref 78.0–100.0)
Platelets: 200 10*3/uL (ref 150–400)
RBC: 4.15 MIL/uL — AB (ref 4.22–5.81)
RDW: 12.9 % (ref 11.5–15.5)
WBC: 8.1 10*3/uL (ref 4.0–10.5)

## 2016-04-22 LAB — TROPONIN I: Troponin I: <0.03 ng/mL (ref <0.031)

## 2016-04-22 MED ORDER — ISOSORBIDE MONONITRATE ER 30 MG PO TB24: 15.0000 mg | ORAL_TABLET | Freq: Every day | ORAL | Status: DC

## 2016-04-22 MED ORDER — FUROSEMIDE 20 MG PO TABS: 20.0000 mg | ORAL_TABLET | Freq: Every day | ORAL | Status: DC | PRN

## 2016-04-22 NOTE — Consult Note (Signed)
CARDIOLOGY CONSULT NOTE   Patient ID: Roger [name] MRN: [id number], DOB/AGE: 1951/06/04   Admit date: 04/21/2016 Date of Consult: 04/22/2016   Primary Physician: Maryelizabeth Rowan, MD Primary Cardiologist: Dr. Allyson Sabal  Pt. Profile  Roger [name] is a 65 year old male with past medical history of HLD, ICM with baseline EF 40-45%, and CAD presented with syncope  Problem List  Past Medical History  Diagnosis Date  . CAD S/P percutaneous coronary angioplasty 11/04/2015    a. Ant STEMI 10/2015 - s/p DES to LAD , (Promus Premier DES 2.75 mm x 16 mm; 3.3 mm), residual ~50+% ulcerated RCA disease being treated medically.   Marland Kitchen CAD (coronary artery disease)     a. Ant STEMI 10/2015 - s/p DES to LAD, residual RCA disease being treated medically.   . Sustained VT (ventricular tachycardia) (HCC)     a. Ischemic -- Had VT-VF during Cath for Anterior STEMI - Defib x 2  . Ischemic cardiomyopathy     a. 12/22 Echo: Post anterior STEMI EF 45-50%, followup echo 11/11/15 EF 40-45%.  . Chronic combined systolic and diastolic CHF (congestive heart failure) (HCC)     a. 2D echo 10/2015 at time of MI: EF 45-50%, grade 1 DD.  Marland Kitchen Hyperglycemia     a. 10/2015 - A1C 6.5.  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 Tobacco abuse   . Hyperlipidemia with target LDL less than 70   . Colon polyp   . ST elevation (STEMI) myocardial infarction involving left anterior descending coronary artery (HCC) 11/04/2015    LAD 100%,   . Anginal pain (HCC)     "since 11/04/2015" (03/02/2016)  . Pneumonia 2016  . Headache     "sometimes daily; sometimes less often; no much now" (03/02/2016)  . Arthritis     "back" (03/02/2016)  . Chronic lower back pain   . Kidney calculus     "passed"  . S/P angioplasty with stent 03/02/16 mLAD angioplasty  03/03/2016  . Syncope and collapse 04/2016    Past Surgical History  Procedure Laterality Date  . Vasectomy    . Transthoracic echocardiogram  12/22 &  12/28 2016    a. Mild LVH, EF 45-50. Mid-apical-anteroseptal hypokinesis, grade 1 DD with high LVEDP and trace MR.; b. EF 40-45%, mid apical anteroseptal, anterior, apical akinesis. Moderate MR, small-moderate posterior pericardial effusion.  . Inguinal hernia repair Left   . Cardiac catheterization N/A 11/04/2015    Procedure: Left Heart Cath and Coronary Angiography;  Surgeon: Runell Gess, MD;  Location: [facility] INVASIVE CV LAB;  Service: Cardiovascular;  mLAD 50% then 100%, mRCA 50% ulcerted -- PCI LAD  . Cardiac catheterization N/A 11/04/2015    Procedure: Coronary Stent Intervention;  Surgeon: Runell Gess, MD;  Location: [facility] INVASIVE CV LAB;  Service: Cardiovascular;  DES PCI to mLAD - Promus Premier 2.75 mm x 16 mm (3.3.mm)  . Coronary angioplasty  03/02/2016  . Cardiac catheterization N/A 03/02/2016    Procedure: Left Heart Cath and Coronary Angiography;  Surgeon: Lennette Bihari, MD;  Location: [facility] INVASIVE CV LAB;  Service: Cardiovascular;  Laterality: N/A;  . Cardiac catheterization N/A 03/02/2016    Procedure: Coronary Balloon Angioplasty;  Surgeon: Lennette Bihari, MD;  Location: [facility] INVASIVE CV LAB;  Service: Cardiovascular;  Laterality: N/A;     Allergies  No Known Allergies  HPI   Roger [name] is a 65 year old male with past medical history of HLD, ICM with baseline  EF 40-45%, and CAD. Patient had a anterior MI in December 2016 and received a Premier DES 2.75 x 16 mm stent to LAD with 50% ulcerated RCA disease treated medically. He had ventricular fibrillation twice during the case require defibrillation but no CPR. 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 2-D echo obtained on 11/03/2015 showed EF 40-45%, akinesis of the mid apical anteroseptal, anterior, and apical myocardium, small to moderate pericardial effusion identified posterior to the heart. He was treated with oral steroids with resolution of symptoms.  Unfortunately he came back in April 2017 with recurrent angina, cardiac catheterization on 03/02/2016 showed 95% in-stent restenosis treated with balloon angioplasty.  Since then he has been relatively stable from cardiac perspective. He continued to exercise and has not noticed any further exertional symptom. He was originally on aspirin and Effient, however was later transitioned to aspirin and Brilinta. He was attending a friends high school graduation party on the night of 6/80/2017. After eating the food and had a beer, he had some flushing sensation and nausea. He went to the bathroom in anticipation of possible vomiting. He says he tried to use the toilet which did not help. When he tried to get up and come out of the bathroom, he suddenly lost consciousness and fell to the ground. His wife was not with him. According to the neighbors, it sounds like he was initially falling face down forward, however ended up landing on his left side. He says he does not remember falling. By the time neighbor walked over to him, he eyes was already open and he regained consciousness. Therefore it was felt that he only passed out for a few seconds.  He was transferred to [facility] on the night of 6/8 for further evaluation and was admitted to internal medicine team. Initial troponin was negative. Creatinine was mildly elevated at 1.2. He was felt to be dehydrated and given IV fluid with improvement in the creatinine down to 1.02. Urinalysis was cloudy, but no obvious nitrite or bacteria. Hemoglobin stable. EKG shows sinus rhythm without obvious ischemic changes. Chest x-ray was normal. Patient denies any chest discomfort prior or after the event. He does complain of some chest soreness and left shoulder soreness that is worse with deep inspiration, body rotation, or palpation which he thinks is related to the fall. Currently he is feeling much better and has no further symptoms. Overnight telemetry did not show  any significant ventricular ectopy.    Inpatient Medications  . aspirin EC  81 mg Oral Daily  . atorvastatin  20 mg Oral QPM  . cholecalciferol  2,000 Units Oral Daily  . enoxaparin (LOVENOX) injection  40 mg Subcutaneous Q24H  . ezetimibe  10 mg Oral Daily  . furosemide  20 mg Oral Daily  . isosorbide mononitrate  15 mg Oral Daily  . metoprolol tartrate  25 mg Oral BID  . potassium chloride  10 mEq Oral Daily  . sodium chloride flush  3 mL Intravenous Q12H  . ticagrelor  90 mg Oral BID    Family History Family History  Problem Relation Age of Onset  . Aneurysm Mother   . Dementia Father   . Dystonia Sister      Social History Social History   Social History  . Marital Status: Married    Spouse Name: N/A  . Number of Children: N/A  . Years of Education: N/A   Occupational History  .  Not on file.   Social History Main Topics  . Smoking status: Former Smoker -- 1.00 packs/day for 50 years    Types: Cigarettes    Quit date: 11/04/2015  . Smokeless tobacco: Never Used  . Alcohol Use: 1.8 oz/week    1 Glasses of wine, 1 Cans of beer, 1 Shots of liquor, 0 Standard drinks or equivalent per week  . Drug Use: No  . Sexual Activity: Not Currently     Comment: married   Other Topics Concern  . Not on file   Social History Narrative     Review of Systems  General:  No chills, fever, night sweats or weight changes.  Cardiovascular:  No dyspnea on exertion, edema, orthopnea, palpitations, paroxysmal nocturnal dyspnea. +sternal pain, L shoulder pain Dermatological: No rash, lesions/masses Respiratory: No cough, dyspnea Urologic: No hematuria, dysuria Abdominal:   No nausea, vomiting, diarrhea, bright red blood per rectum, melena, or hematemesis Neurologic:  No visual changes, wkns, changes in mental status. All other systems reviewed and are otherwise negative except as noted above.  Physical Exam  Blood pressure 108/56, pulse 77, temperature 98 F (36.7 C),  temperature source Oral, resp. rate 16, height 5\' 11"  (1.803 m), weight 196 lb 14.4 oz (89.313 kg), SpO2 96 %.  General: Pleasant, NAD Psych: Normal affect. Neuro: Alert and oriented X 3. Moves all extremities spontaneously. HEENT: Normal  Neck: Supple without bruits or JVD. Lungs:  Resp regular and unlabored, CTA. Heart: RRR no s3, s4, or murmurs. Abdomen: Soft, non-tender, non-distended, BS + x 4.  Extremities: No clubbing, cyanosis or edema. DP/PT/Radials 2+ and equal bilaterally.  Labs   Recent Labs  04/22/16 0454  TROPONINI <0.03   Lab Results  Component Value Date   WBC 8.1 04/22/2016   HGB 12.5* 04/22/2016   HCT 38.0* 04/22/2016   MCV 91.6 04/22/2016   PLT 200 04/22/2016    Recent Labs Lab 04/22/16 0454  NA 139  K 3.7  CL 109  CO2 26  BUN 16  CREATININE 1.02  CALCIUM 8.5*  GLUCOSE 117*   Lab Results  Component Value Date   CHOL 116* 12/24/2015   HDL 42 12/24/2015   LDLCALC 54 12/24/2015   TRIG 101 12/24/2015   Lab Results  Component Value Date   DDIMER <0.27 11/07/2015    Radiology/Studies  Dg Chest Port 1 View  04/21/2016  CLINICAL DATA:  65 year old male with syncope EXAM: PORTABLE CHEST 1 VIEW COMPARISON:  Radiograph dated 11/06/2015 FINDINGS: The heart size and mediastinal contours are within normal limits. Both lungs are clear. The visualized skeletal structures are unremarkable. IMPRESSION: No active disease. Electronically Signed   By: Elgie Collard M.D.   On: 04/21/2016 23:16    ECG  NSR without significant ST-T wave changes   ASSESSMENT AND PLAN  1. Syncope: Differential diagnosis including vasovagal versus arrhythmia versus dehydration  - More likely diagnosis is vasovagal in this setting given the nausea and a flushing sensation prior to the event. Per patient, he never has flushing sensation with drinking alcohol before and does not believe yesterday's flushing sensation was related to his alcohol.  - discussed with Dr. Anne Fu,  symptom sounds more vagal, will hold off on outpatient monitor. Telemetry did not show significant arrhythmia.   - I do not believe there is a cardiac event given negative troponin, nonischemic EKG, would not expect any further ischemic workup in this case. Of note, patient walked a 1.5 mile to the party yesterday prior to  the event with no exertional symptoms.  - to avoid dehydration, will change lasix to PRN instead  2. CAD: no obvious angina  3. ICM with baseline EF 40-45%  4. HLD   Signed, Azalee CourseHao Meng, PA-C 04/22/2016, 11:30 AM Personally seen and examined. Agree with above.  Vasovagal syncope-Had classic prodrome of clamminess, flushing, nausea, dehydration resulting in brief syncopal episode. He wonders if hot sauce triggered his nausea which then caused the vagal spiral to occur. -Stop Lasix 20 mg a day and take now as needed -His blood pressures at home have been running fairly low recently. May need to pull back on medications further. -Telemetry here has been unremarkable. EKG personally reviewed shows no adverse intervals.  He is regular rate and rhythm, lungs are clear, no edema. Protuberant abdomen.  From our aspect, he is fine for discharge. We will have follow-up in office. If symptoms return or become more worrisome for arrhythmia related syncope, we will place on event monitor in the future.  Donato SchultzMark Skains, MD

## 2016-04-22 NOTE — ED Notes (Signed)
Attempted to call report. Floor RN unable to accept report.  

## 2016-04-22 NOTE — Discharge Summary (Signed)
Physician Discharge Summary  STANELY SEXSON MRN: [id number] DOB/AGE: June 26, 1951 65 y.o.  PCP: Rachell Cipro, MD   Admit date: 04/21/2016 Discharge date: 04/22/2016  Discharge Diagnoses:     Principal Problem:   Syncope and collapse Active Problems:   Hyperlipidemia   CAD S/P percutaneous coronary angioplasty   Chronic combined systolic and diastolic heart failure, NYHA class 1 (HCC)    Follow-up recommendations Follow-up with PCP in 3-5 days , including all  additional recommended appointments as below Follow-up CBC, CMP in 3-5 days Patient to follow-up with cardiology, Dr. Gwenlyn Found as needed      Current Discharge Medication List    CONTINUE these medications which have CHANGED   Details  furosemide (LASIX) 20 MG tablet Take 1 tablet (20 mg total) by mouth daily as needed. Take extra dose if needed Qty: 180 tablet, Refills: 2    isosorbide mononitrate (IMDUR) 30 MG 24 hr tablet Take 0.5 tablets (15 mg total) by mouth daily. Qty: 30 tablet, Refills: 1      CONTINUE these medications which have NOT CHANGED   Details  aspirin EC 81 MG EC tablet Take 1 tablet (81 mg total) by mouth daily. Qty: 30 tablet, Refills: 11    atorvastatin (LIPITOR) 20 MG tablet Take 1 tablet (20 mg total) by mouth every evening. Qty: 90 tablet, Refills: 3    Cholecalciferol (VITAMIN D3) 1000 UNITS CAPS Take 2,000 Units by mouth daily.     ezetimibe (ZETIA) 10 MG tablet Take 1 tablet (10 mg total) by mouth daily. Qty: 90 tablet, Refills: 3    metoprolol tartrate (LOPRESSOR) 25 MG tablet Take 1 tablet (25 mg total) by mouth 2 (two) times daily. Qty: 60 tablet, Refills: 6    nitroGLYCERIN (NITROSTAT) 0.4 MG SL tablet Place 1 tablet (0.4 mg total) under the tongue every 5 (five) minutes as needed for chest pain (up to 3 doses). Qty: 25 tablet, Refills: 3    potassium chloride (K-DUR,KLOR-CON) 10 MEQ tablet Take 1 tablet (10 mEq total) by mouth daily. Qty: 30 tablet, Refills: 6     ticagrelor (BRILINTA) 90 MG TABS tablet Take 1 tablet (90 mg total) by mouth 2 (two) times daily. Qty: 180 tablet, Refills: 3      STOP taking these medications     ibuprofen (ADVIL,MOTRIN) 200 MG tablet          Discharge Condition:Stable    Discharge Instructions Get Medicines reviewed and adjusted: Please take all your medications with you for your next visit with your Primary MD  Please request your Primary MD to go over all hospital tests and procedure/radiological results at the follow up, please ask your Primary MD to get all Hospital records sent to his/her office.  If you experience worsening of your admission symptoms, develop shortness of breath, life threatening emergency, suicidal or homicidal thoughts you must seek medical attention immediately by calling 911 or calling your MD immediately if symptoms less severe.  You must read complete instructions/literature along with all the possible adverse reactions/side effects for all the Medicines you take and that have been prescribed to you. Take any new Medicines after you have completely understood and accpet all the possible adverse reactions/side effects.   Do not drive when taking Pain medications.   Do not take more than prescribed Pain, Sleep and Anxiety Medications  Special Instructions: If you have smoked or chewed Tobacco in the last 2 yrs please stop smoking, stop any regular Alcohol and or any Recreational drug use.  Wear Seat belts while driving.  Please note  You were cared for by a hospitalist during your hospital stay. Once you are discharged, your primary care physician will handle any further medical issues. Please note that NO REFILLS for any discharge medications will be authorized once you are discharged, as it is imperative that you return to your primary care physician (or establish a relationship with a primary care physician if you do not have one) for your aftercare needs so that they can  reassess your need for medications and monitor your lab values.  Discharge Instructions    Diet - low sodium heart healthy    Complete by:  As directed      Increase activity slowly    Complete by:  As directed             No Known Allergies    Disposition: 01-Home or Self Care   Consults: * Cardiology     Significant Diagnostic Studies:  Dg Chest Port 1 View  04/21/2016  CLINICAL DATA:  65 year old male with syncope EXAM: PORTABLE CHEST 1 VIEW COMPARISON:  Radiograph dated 11/06/2015 FINDINGS: The heart size and mediastinal contours are within normal limits. Both lungs are clear. The visualized skeletal structures are unremarkable. IMPRESSION: No active disease. Electronically Signed   By: Anner Crete M.D.   On: 04/21/2016 23:16        Filed Weights   04/21/16 2054 04/22/16 0121  Weight: 87.998 kg (194 lb) 89.313 kg (196 lb 14.4 oz)     Microbiology: No results found for this or any previous visit (from the past 240 hour(s)).     Blood Culture No results found for: SDES, [place], CULT, REPTSTATUS    Labs: Results for orders placed or performed during the hospital encounter of 04/21/16 (from the past 48 hour(s))  CBC with Differential/Platelet     Status: None   Collection Time: 04/21/16  9:55 PM  Result Value Ref Range   WBC 8.1 4.0 - 10.5 K/uL   RBC 4.28 4.22 - 5.81 MIL/uL   Hemoglobin 13.1 13.0 - 17.0 g/dL   HCT 39.5 39.0 - 52.0 %   MCV 92.3 78.0 - 100.0 fL   MCH 30.6 26.0 - 34.0 pg   MCHC 33.2 30.0 - 36.0 g/dL   RDW 12.8 11.5 - 15.5 %   Platelets 210 150 - 400 K/uL   Neutrophils Relative % 60 %   Neutro Abs 4.8 1.7 - 7.7 K/uL   Lymphocytes Relative 28 %   Lymphs Abs 2.3 0.7 - 4.0 K/uL   Monocytes Relative 10 %   Monocytes Absolute 0.8 0.1 - 1.0 K/uL   Eosinophils Relative 2 %   Eosinophils Absolute 0.2 0.0 - 0.7 K/uL   Basophils Relative 0 %   Basophils Absolute 0.0 0.0 - 0.1 K/uL  I-stat chem 8, ed     Status: Abnormal    Collection Time: 04/21/16 10:04 PM  Result Value Ref Range   Sodium 140 135 - 145 mmol/L   Potassium 3.8 3.5 - 5.1 mmol/L   Chloride 102 101 - 111 mmol/L   BUN 19 6 - 20 mg/dL   Creatinine, Ser 1.20 0.61 - 1.24 mg/dL   Glucose, Bld 120 (H) 65 - 99 mg/dL   Calcium, Ion 1.17 1.13 - 1.30 mmol/L   TCO2 24 0 - 100 mmol/L   Hemoglobin 13.3 13.0 - 17.0 g/dL   HCT 39.0 39.0 - 52.0 %  I-stat troponin, ED  Status: None   Collection Time: 04/21/16 11:00 PM  Result Value Ref Range   Troponin i, poc 0.00 0.00 - 0.08 ng/mL   Comment 3            Comment: Due to the release kinetics of cTnI, a negative result within the first hours of the onset of symptoms does not rule out myocardial infarction with certainty. If myocardial infarction is still suspected, repeat the test at appropriate intervals.   Urinalysis, Routine w reflex microscopic (not at [facility])     Status: Abnormal   Collection Time: 04/22/16 12:44 AM  Result Value Ref Range   Color, Urine YELLOW YELLOW   APPearance CLOUDY (A) CLEAR   Specific Gravity, Urine 1.029 1.005 - 1.030   pH 6.5 5.0 - 8.0   Glucose, UA NEGATIVE NEGATIVE mg/dL   Hgb urine dipstick NEGATIVE NEGATIVE   Bilirubin Urine NEGATIVE NEGATIVE   Ketones, ur 15 (A) NEGATIVE mg/dL   Protein, ur NEGATIVE NEGATIVE mg/dL   Nitrite NEGATIVE NEGATIVE   Leukocytes, UA NEGATIVE NEGATIVE    Comment: MICROSCOPIC NOT DONE ON URINES WITH NEGATIVE PROTEIN, BLOOD, LEUKOCYTES, NITRITE, OR GLUCOSE <1000 mg/dL.  CBC     Status: Abnormal   Collection Time: 04/22/16  4:54 AM  Result Value Ref Range   WBC 8.1 4.0 - 10.5 K/uL   RBC 4.15 (L) 4.22 - 5.81 MIL/uL   Hemoglobin 12.5 (L) 13.0 - 17.0 g/dL   HCT 38.0 (L) 39.0 - 52.0 %   MCV 91.6 78.0 - 100.0 fL   MCH 30.1 26.0 - 34.0 pg   MCHC 32.9 30.0 - 36.0 g/dL   RDW 12.9 11.5 - 15.5 %   Platelets 200 150 - 400 K/uL  Basic metabolic panel     Status: Abnormal   Collection Time: 04/22/16  4:54 AM  Result Value Ref Range   Sodium  139 135 - 145 mmol/L   Potassium 3.7 3.5 - 5.1 mmol/L   Chloride 109 101 - 111 mmol/L   CO2 26 22 - 32 mmol/L   Glucose, Bld 117 (H) 65 - 99 mg/dL   BUN 16 6 - 20 mg/dL   Creatinine, Ser 1.02 0.61 - 1.24 mg/dL   Calcium 8.5 (L) 8.9 - 10.3 mg/dL   GFR calc non Af Amer >60 >60 mL/min   GFR calc Af Amer >60 >60 mL/min    Comment: (NOTE) The eGFR has been calculated using the CKD EPI equation. This calculation has not been validated in all clinical situations. eGFR's persistently <60 mL/min signify possible Chronic Kidney Disease.    Anion gap 4 (L) 5 - 15  Troponin I     Status: None   Collection Time: 04/22/16  4:54 AM  Result Value Ref Range   Troponin I <0.03 <0.031 ng/mL    Comment:        NO INDICATION OF MYOCARDIAL INJURY.      Lipid Panel     Component Value Date/Time   CHOL 116* 12/24/2015 0841   TRIG 101 12/24/2015 0841   HDL 42 12/24/2015 0841   CHOLHDL 2.8 12/24/2015 0841   VLDL 20 12/24/2015 0841   LDLCALC 54 12/24/2015 0841        Lab Results  Component Value Date   LDLCALC 54 12/24/2015   CREATININE 1.02 04/22/2016     HPI : Mr. Ludington is a 65 year old male with past medical history of HLD, ICM with baseline EF 40-45%, and CAD. Patient had a anterior MI in  December 2016 and received a Premier DES 2.75 x 16 mm stent to LAD with 50% ulcerated RCA disease treated medically. He had ventricular fibrillation twice during the case require defibrillation but no CPR. 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 2-D echo obtained on 11/03/2015 showed EF 40-45%, akinesis of the mid apical anteroseptal, anterior, and apical myocardium, small to moderate pericardial effusion identified posterior to the heart. He was treated with oral steroids with resolution of symptoms. Unfortunately he came back in April 2017 with recurrent angina, cardiac catheterization on 03/02/2016 showed 95% in-stent restenosis  treated with balloon angioplasty.  Since then he has been relatively stable from cardiac perspective. He continued to exercise and has not noticed any further exertional symptom. He was originally on aspirin and Effient, however was later transitioned to aspirin and Brilinta. He was attending a friends high school graduation party on the night of 6/80/2017. After eating the food and had a beer, he had some flushing sensation and nausea. He went to the bathroom in anticipation of possible vomiting. He says he tried to use the toilet which did not help. When he tried to get up and come out of the bathroom, he suddenly lost consciousness and fell to the ground. His wife was not with him. According to the neighbors, it sounds like he was initially falling face down forward, however ended up landing on his left side. He says he does not remember falling. By the time neighbor walked over to him, he eyes was already open and he regained consciousness. Therefore it was felt that he only passed out for a few seconds.  He was transferred to [facility] on the night of 6/8 for further evaluation and was admitted . Initial troponin was negative. Creatinine was mildly elevated at 1.2. He was felt to be dehydrated and given IV fluid with improvement in the creatinine down to 1.02. Urinalysis was cloudy, but no obvious nitrite or bacteria. Hemoglobin stable. EKG shows sinus rhythm without obvious ischemic changes. Chest x-ray was normal.  . Overnight telemetry did not show any significant ventricular ectopy.    HOSPITAL COURSE:   Vasovagal Syncope and collapse: On differential includes arrhythmia versus vasovagal secondary to dehydration  Telemetry shows normal sinus rhythm, Cardiac enzymes negative 1, point-of-care Troponinnegative 1 -Patient Slightly orthostatic on admission - Status post IVF NS at 75 ml x 565m,  Cardiology consultationobtained  Due to recent history of Ventricular fibrillation,  patient may need an event monitor Had classic prodrome of clamminess, flushing, nausea, dehydration resulting in brief syncopal episode. He wonders if hot sauce triggered his nausea which then caused the vagal spiral to occur. -Stop Lasix 20 mg a day and take now as needed Reduced dose of imdur , If symptoms return or become more worrisome for arrhythmia related syncope, we will place on event monitor in the future  Chronic combined systolic and diastolic heart failure, NYHA class 1 ([place]: Last EF noted to be 40-45%. Patient appears to be euvolemic at this time. - Continue metoprolol, reduce  isosorbide mononitrate, Lasix prn . No ACE with EF > 40%     Coronary artery disease s/p stent: In 02/2016 the patient underwent a cardiac cath in which he was noted to have 50% restenosis of the previous stent placed back in 10/2015 to the DES to LAD.  - Continue Brilinta and aspirin   Hyperlipidemia - Continue Zetia and atorvastatin   Discharge Exam:  Blood pressure 104/47, pulse 73, temperature 98.3 F (36.8 C), temperature source Oral, resp. rate 16, height 5' 11"  (1.803 m), weight 89.313 kg (196 lb 14.4 oz), SpO2 98 %.  Neck: normal, supple, no masses, no thyromegaly Respiratory: clear to auscultation bilaterally, no wheezing, no crackles. Normal respiratory effort. No accessory muscle use.  Cardiovascular: Regular rate and rhythm, no murmurs / rubs / gallops. Trace bilateral lower extremity edema. 2+ pedal pulses. No carotid bruits.  Abdomen: no tenderness, no masses palpated. No hepatosplenomegaly. Bowel sounds positive.  Musculoskeletal: no clubbing / cyanosis. No joint deformity upper and lower extremities. Good ROM, no contractures. Normal muscle tone.  Skin: no rashes, lesions, ulcers. No induration Neurologic: CN 2-12 grossly intact. Sensation intact, DTR normal. Strength 5/5 in all 4.  Psychiatric: Normal judgment and insight. Alert and oriented x 3. Normal mood.      Follow-up Information    Follow up with Kerin Ransom, PA-C On 06/09/2016.   Specialties:  Cardiology, Radiology   Why:  9:00AM. Cardiology followup with Dr. Kennon Holter PA/NP after hospitalization   Contact information:   [address] [place] [address] [phone]       Signed: Reyne Dumas 04/22/2016, 1:10 PM        Time spent >45 mins

## 2016-04-22 NOTE — Progress Notes (Signed)
  Echocardiogram 2D Echocardiogram has been performed.  Tye SavoyCasey N Adi Doro 04/22/2016, 2:12 PM

## 2016-04-22 NOTE — ED Notes (Signed)
Pt ambulated to restroom unassisted with steady gait

## 2016-05-05 ENCOUNTER — Telehealth: Payer: Self-pay | Admitting: Cardiovascular Disease

## 2016-05-05 NOTE — Telephone Encounter (Signed)
Message routed to Dr. Thayer HeadingsBerry & Jennifer, RN

## 2016-05-05 NOTE — Telephone Encounter (Signed)
[facility] SBE (antibiotic) prophylaxis is indicated for only the following conditions: - prosthetic heart valves (bioprosthetic & homograft) - prior hx of IE - unrepaired cyanotic congenital heart disease - completely repaired congenital heart defects - repaired congenital heart defects with residual defects - valve regurgitation d/t structurally abnormal valve in transplanted heart  Patient does not have any of the above conditions.   Message routed to dentist via EPIC

## 2016-05-05 NOTE — Telephone Encounter (Signed)
New message     What dental office are you calling from? Dr Albin Fellinghou What is your office phone and fax number? Fax [phone] What type of procedure is the patient having performed? Regular cleaning 1. What date is procedure scheduled? Not scheduled yet  2. What is your question (ex. Antibiotics prior to procedure, holding medication-we need to know how long dentist wants pt to hold med)? Will pt need antibiotics prior to cleaning?

## 2016-05-05 NOTE — Telephone Encounter (Signed)
He cannot stop his antiplatelet therapy until December of this year.

## 2016-05-10 ENCOUNTER — Encounter: Payer: Self-pay | Admitting: Cardiology

## 2016-05-10 ENCOUNTER — Ambulatory Visit (INDEPENDENT_AMBULATORY_CARE_PROVIDER_SITE_OTHER): Payer: BLUE CROSS/BLUE SHIELD | Admitting: Cardiology

## 2016-05-10 VITALS — BP 118/80 | HR 79 | Ht 70.0 in | Wt 202.6 lb

## 2016-05-10 DIAGNOSIS — R55 Syncope and collapse: Secondary | ICD-10-CM

## 2016-05-10 DIAGNOSIS — Z9861 Coronary angioplasty status: Secondary | ICD-10-CM

## 2016-05-10 DIAGNOSIS — I2102 ST elevation (STEMI) myocardial infarction involving left anterior descending coronary artery: Secondary | ICD-10-CM | POA: Diagnosis not present

## 2016-05-10 DIAGNOSIS — I2510 Atherosclerotic heart disease of native coronary artery without angina pectoris: Secondary | ICD-10-CM

## 2016-05-10 DIAGNOSIS — I241 Dressler's syndrome: Secondary | ICD-10-CM

## 2016-05-10 DIAGNOSIS — I255 Ischemic cardiomyopathy: Secondary | ICD-10-CM

## 2016-05-10 DIAGNOSIS — Z720 Tobacco use: Secondary | ICD-10-CM

## 2016-05-10 DIAGNOSIS — E785 Hyperlipidemia, unspecified: Secondary | ICD-10-CM

## 2016-05-10 NOTE — Assessment & Plan Note (Signed)
Post MI- Rx'd with steroids

## 2016-05-10 NOTE — Progress Notes (Signed)
05/10/2016 Ardelle ParkJeffrey M Kubin   1951/09/11  [id number]  Primary Physician Maryelizabeth RowanEWEY,ELIZABETH, MD Primary Cardiologist: Dr Allyson SabalBerry  HPI:  65 y.o. male with a history of anterior STEMI on 11/04/15 treated with direct intervention using a Promus struggling stent. He had moderate noncritical disease otherwise. He did have ventricular fibrillation twice during the case requiring defibrillation but no CPR was required. He was discharged home 4 days later. 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 A 2-D echo performed 11/11/15 revealed ejection fraction of 40-45% with anteroapical wall motion abnormality and mild to moderate posterior pericardial effusion. He was treated with oral steroids and his symptoms resolved. He was seen in the office 02/16/16 and complained of chest pain while at cardiac rehab. Myoview as an OP was abnormal and he was admitted for cath 4/191/17. This revealed ISR of the previously placed Promus DES. He was treated with PCI (Angiosculpt scoring balloon and noncompliant balloon) with good results. He was noted to be improved on f/u 03/15/16.           He then presented to the ED after a syncopal spell 04/21/16.  He was attending a friends high school graduation party on the night of 04/21/2016. After eating the food and having a beer, he had some flushing sensation and nausea. He went to the bathroom in anticipation of possible vomiting. He says he tried to use the toilet which did not help. When he tried to get up and come out of the bathroom, he suddenly lost consciousness and fell to the ground. He denied chest pain. He may have been mildly dehydrated. Overnight telemetry did not show any arrhythmia. Echo done 04/22/16 showed his EF to be 45-50%. He is in the office today for follow up. He has not had any recurrent syncope or near syncope.    Current Outpatient Prescriptions  Medication Sig Dispense Refill  . aspirin EC  81 MG EC tablet Take 1 tablet (81 mg total) by mouth daily. 30 tablet 11  . atorvastatin (LIPITOR) 20 MG tablet Take 1 tablet (20 mg total) by mouth every evening. 90 tablet 3  . Cholecalciferol (VITAMIN D3) 1000 UNITS CAPS Take 2,000 Units by mouth daily.     Marland Kitchen. ezetimibe (ZETIA) 10 MG tablet Take 1 tablet (10 mg total) by mouth daily. 90 tablet 3  . isosorbide mononitrate (IMDUR) 30 MG 24 hr tablet Take 0.5 tablets (15 mg total) by mouth daily. 30 tablet 1  . metoprolol tartrate (LOPRESSOR) 25 MG tablet Take 1 tablet (25 mg total) by mouth 2 (two) times daily. 60 tablet 6  . nitroGLYCERIN (NITROSTAT) 0.4 MG SL tablet Place 1 tablet (0.4 mg total) under the tongue every 5 (five) minutes as needed for chest pain (up to 3 doses). 25 tablet 3  . ticagrelor (BRILINTA) 90 MG TABS tablet Take 1 tablet (90 mg total) by mouth 2 (two) times daily. 180 tablet 3   No current facility-administered medications for this visit.    No Known Allergies  Social History   Social History  . Marital Status: Married    Spouse Name: N/A  . Number of Children: N/A  . Years of Education: N/A   Occupational History  . Not on file.   Social History Main Topics  . Smoking status: Former Smoker -- 1.00 packs/day for 50 years    Types: Cigarettes    Quit date: 11/04/2015  . Smokeless tobacco: Never Used  .  Alcohol Use: 1.8 oz/week    1 Glasses of wine, 1 Cans of beer, 1 Shots of liquor, 0 Standard drinks or equivalent per week  . Drug Use: No  . Sexual Activity: Not Currently     Comment: married   Other Topics Concern  . Not on file   Social History Narrative     Review of Systems: General: negative for chills, fever, night sweats or weight changes.  Cardiovascular: negative for chest pain, dyspnea on exertion, edema, orthopnea, palpitations, paroxysmal nocturnal dyspnea or shortness of breath Dermatological: negative for rash Respiratory: negative for cough or wheezing Urologic: negative for  hematuria Abdominal: negative for nausea, vomiting, diarrhea, bright red blood per rectum, melena, or hematemesis Neurologic: negative for visual changes, syncope, or dizziness All other systems reviewed and are otherwise negative except as noted above.    Blood pressure 118/80, pulse 79, height 5\' 10"  (1.778 m), weight 202 lb 9.6 oz (91.899 kg).  General appearance: alert, cooperative and no distress Neck: no carotid bruit and no JVD Lungs: clear to auscultation bilaterally Heart: regular rate and rhythm Extremities: extremities normal, atraumatic, no cyanosis or edema Skin: Skin color, texture, turgor normal. No rashes or lesions Neurologic: Grossly normal   ASSESSMENT AND PLAN:   CAD S/P percutaneous coronary angioplasty LAD DES Dec 2016 with ISR -re do PCI- after he had chest pain and an abnormal Myoview April 2017  Syncope and collapse Admitted 04/21/16 with presumed vaso vagal syncope- no recurrence  Dressler's syndrome (HCC) Post MI- Rx'd with steroids  Cardiomyopathy, ischemic EF 45-50% by echo June 2017  Hyperlipidemia On statin and Zetia  Tobacco abuse Currently not smoking    PLAN  Same Rx- f/u in 6 months.   Corine ShelterLuke Sian Rockers PA-C 05/10/2016 4:22 PM

## 2016-05-10 NOTE — Assessment & Plan Note (Addendum)
Admitted 04/21/16 with presumed vaso vagal syncope- no recurrence

## 2016-05-10 NOTE — Assessment & Plan Note (Addendum)
EF 45-50% by echo June 2017

## 2016-05-10 NOTE — Patient Instructions (Signed)
Your physician recommends that you continue on your current medications as directed. Please refer to the Current Medication list given to you today.  Please keep your appointments for your echocardiogram (05/30/16) and follow-up with Dr Allyson SabalBerry (06/07/16).  If you need a refill on your cardiac medications before your next appointment, please call your pharmacy.

## 2016-05-10 NOTE — Assessment & Plan Note (Signed)
Currently not smoking 

## 2016-05-10 NOTE — Assessment & Plan Note (Signed)
LAD DES Dec 2016 with ISR -re do PCI- after he had chest pain and an abnormal Myoview April 2017

## 2016-05-10 NOTE — Assessment & Plan Note (Signed)
On statin and Zetia. 

## 2016-05-30 ENCOUNTER — Ambulatory Visit (HOSPITAL_COMMUNITY): Payer: BLUE CROSS/BLUE SHIELD | Attending: Cardiology

## 2016-05-30 ENCOUNTER — Other Ambulatory Visit: Payer: Self-pay

## 2016-05-30 DIAGNOSIS — Z87891 Personal history of nicotine dependence: Secondary | ICD-10-CM | POA: Diagnosis not present

## 2016-05-30 DIAGNOSIS — E785 Hyperlipidemia, unspecified: Secondary | ICD-10-CM | POA: Diagnosis not present

## 2016-05-30 DIAGNOSIS — I517 Cardiomegaly: Secondary | ICD-10-CM | POA: Diagnosis not present

## 2016-05-30 DIAGNOSIS — I2510 Atherosclerotic heart disease of native coronary artery without angina pectoris: Secondary | ICD-10-CM | POA: Diagnosis not present

## 2016-05-30 DIAGNOSIS — I340 Nonrheumatic mitral (valve) insufficiency: Secondary | ICD-10-CM | POA: Insufficient documentation

## 2016-05-30 DIAGNOSIS — I255 Ischemic cardiomyopathy: Secondary | ICD-10-CM | POA: Insufficient documentation

## 2016-05-30 DIAGNOSIS — I371 Nonrheumatic pulmonary valve insufficiency: Secondary | ICD-10-CM | POA: Insufficient documentation

## 2016-05-30 DIAGNOSIS — I3139 Other pericardial effusion (noninflammatory): Secondary | ICD-10-CM

## 2016-05-30 DIAGNOSIS — I319 Disease of pericardium, unspecified: Secondary | ICD-10-CM | POA: Insufficient documentation

## 2016-05-30 DIAGNOSIS — I313 Pericardial effusion (noninflammatory): Secondary | ICD-10-CM

## 2016-06-07 ENCOUNTER — Ambulatory Visit (INDEPENDENT_AMBULATORY_CARE_PROVIDER_SITE_OTHER): Payer: BLUE CROSS/BLUE SHIELD | Admitting: Cardiovascular Disease

## 2016-06-07 ENCOUNTER — Encounter: Payer: Self-pay | Admitting: Cardiovascular Disease

## 2016-06-07 VITALS — BP 120/78 | HR 78 | Ht 70.0 in | Wt 200.6 lb

## 2016-06-07 DIAGNOSIS — Z9861 Coronary angioplasty status: Secondary | ICD-10-CM | POA: Diagnosis not present

## 2016-06-07 DIAGNOSIS — Z79899 Other long term (current) drug therapy: Secondary | ICD-10-CM | POA: Diagnosis not present

## 2016-06-07 DIAGNOSIS — E785 Hyperlipidemia, unspecified: Secondary | ICD-10-CM

## 2016-06-07 DIAGNOSIS — I2510 Atherosclerotic heart disease of native coronary artery without angina pectoris: Secondary | ICD-10-CM | POA: Diagnosis not present

## 2016-06-07 NOTE — Assessment & Plan Note (Signed)
History of hyperlipidemia on statin drug and Zetia. We will recheck a lipid and liver profile

## 2016-06-07 NOTE — Patient Instructions (Signed)
Medication Instructions:  Your physician recommends that you continue on your current medications as directed. Please refer to the Current Medication list given to you today.   Labwork: Your physician recommends that you return for lab work AT YOUR EARLIEST CONVENIENCE- YOU WILL NEED TO BE FASTING. The lab can be found on the FIRST FLOOR of out building in [address]   Follow-Up: Your physician wants you to follow-up in: 3 MONTHS WITH DR Allyson Sabal. You will receive a reminder letter in the mail two months in advance. If you don't receive a letter, please call our office to schedule the follow-up appointment.   If you need a refill on your cardiac medications before your next appointment, please call your pharmacy.

## 2016-06-07 NOTE — Assessment & Plan Note (Signed)
History of CAD status post anterior wall myocardial infarction 11/04/15 she did with PCI and drug-eluting stenting of his proximal LAD excellent result. Because of recurrent symptoms a Myoview stress test performed in a April was read as high risk and he underwent recatheterization by Dr. Tresa Endo 03/02/16 revealing aggressive in-stent restenosis. His ejection fraction at that time was 40-45% with anterolateral wall motion abnormality. He underwent cutting balloon angioplasty of his LAD successfully. Recent follow-up echo last week showed normalization of his ejection fraction. He no longer has no symptoms other occasionally gets palpitations.

## 2016-06-07 NOTE — Progress Notes (Signed)
06/07/2016 Roger [name]   1951/07/08  [id number]  Primary Physician Maryelizabeth Rowan, MD Primary Cardiologist: Runell Gess MD Roseanne Reno  HPI:  Roger [name] is a 65 year old gentleman with a history of recent anterior STEMI on 11/04/15. With direct intervention using a Promus struggling stent. He had noncritical disease otherwise. He did have ventricular fibrillation twice during the case requiring defibrillation but no CPR was performed. He was discharged home 4 days later. 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 A 2-D echo performed 11/11/15 revealed ejection fraction of 40-45% with anteroapical wall motion abnormality and mild to moderate posterior pericardial effusion. He saw Dr. Herbie Baltimore who began oral steroids. His symptoms resolved. He was a smoker prior to this and hasn't stopped smoking. I last saw him in the office 12/01/15. Because of recurrent symptoms he underwent stress testing in late April revealing ischemia in the LAD territory and ultimately he underwent recatheterization by Dr. Tresa Endo 03/02/16 revealing aggressive in-stent restenosis within the LAD stent. He underwent angiosculpt  cutting balloon atherectomy successfully. His EF at that time was 40-45% with mild anterolateral hypokinesia which has subsequently improved by 2-D echo performed last week.    Current Outpatient Prescriptions  Medication Sig Dispense Refill  . aspirin EC 81 MG EC tablet Take 1 tablet (81 mg total) by mouth daily. 30 tablet 11  . atorvastatin (LIPITOR) 20 MG tablet Take 1 tablet (20 mg total) by mouth every evening. 90 tablet 3  . Cholecalciferol (VITAMIN D3) 1000 UNITS CAPS Take 2,000 Units by mouth daily.     Marland Kitchen ezetimibe (ZETIA) 10 MG tablet Take 1 tablet (10 mg total) by mouth daily. 90 tablet 3  . isosorbide mononitrate (IMDUR) 30 MG 24 hr tablet Take 0.5 tablets (15 mg total) by mouth daily. 30  tablet 1  . metoprolol tartrate (LOPRESSOR) 25 MG tablet Take 1 tablet (25 mg total) by mouth 2 (two) times daily. 60 tablet 6  . nitroGLYCERIN (NITROSTAT) 0.4 MG SL tablet Place 1 tablet (0.4 mg total) under the tongue every 5 (five) minutes as needed for chest pain (up to 3 doses). 25 tablet 3  . ticagrelor (BRILINTA) 90 MG TABS tablet Take 1 tablet (90 mg total) by mouth 2 (two) times daily. 180 tablet 3   No current facility-administered medications for this visit.     No Known Allergies  Social History   Social History  . Marital status: Married    Spouse name: N/A  . Number of children: N/A  . Years of education: N/A   Occupational History  . Not on file.   Social History Main Topics  . Smoking status: Former Smoker    Packs/day: 1.00    Years: 50.00    Types: Cigarettes    Quit date: 11/04/2015  . Smokeless tobacco: Never Used  . Alcohol use 1.8 oz/week    1 Glasses of wine, 1 Cans of beer, 1 Shots of liquor per week  . Drug use: No  . Sexual activity: Not Currently     Comment: married   Other Topics Concern  . Not on file   Social History Narrative  . No narrative on file     Review of Systems: General: negative for chills, fever, night sweats or weight changes.  Cardiovascular: negative for chest pain, dyspnea on exertion, edema, orthopnea, palpitations, paroxysmal nocturnal dyspnea or shortness of breath Dermatological: negative for rash Respiratory: negative for  cough or wheezing Urologic: negative for hematuria Abdominal: negative for nausea, vomiting, diarrhea, bright red blood per rectum, melena, or hematemesis Neurologic: negative for visual changes, syncope, or dizziness All other systems reviewed and are otherwise negative except as noted above.    Blood pressure 120/78, pulse 78, height 5\' 10"  (1.778 m), weight 200 lb 9.6 oz (91 kg).  General appearance: alert and no distress Neck: no adenopathy, no carotid bruit, no JVD, supple, symmetrical,  trachea midline and thyroid not enlarged, symmetric, no tenderness/mass/nodules Lungs: clear to auscultation bilaterally Heart: regular rate and rhythm, S1, S2 normal, no murmur, click, rub or gallop Extremities: extremities normal, atraumatic, no cyanosis or edema  EKG not performed today  ASSESSMENT AND PLAN:   CAD S/P percutaneous coronary angioplasty History of CAD status post anterior wall myocardial infarction 11/04/15 she did with PCI and drug-eluting stenting of his proximal LAD excellent result. Because of recurrent symptoms a Myoview stress test performed in a April was read as high risk and he underwent recatheterization by Dr. Tresa Endo 03/02/16 revealing aggressive in-stent restenosis. His ejection fraction at that time was 40-45% with anterolateral wall motion abnormality. He underwent cutting balloon angioplasty of his LAD successfully. Recent follow-up echo last week showed normalization of his ejection fraction. He no longer has no symptoms other occasionally gets palpitations.  Hyperlipidemia History of hyperlipidemia on statin drug and Zetia. We will recheck a lipid and liver profile      Runell Gess MD [facility], [facility] 06/07/2016 11:07 AM

## 2016-06-11 LAB — LIPID PANEL
Cholesterol: 97 mg/dL — ABNORMAL LOW (ref 125–200)
HDL: 50 mg/dL (ref >=40)
LDL CALC: 28 mg/dL (ref <130)
Total CHOL/HDL Ratio: 1.9 Ratio (ref <=5.0)
Triglycerides: 93 mg/dL (ref <150)
VLDL: 19 mg/dL (ref <30)

## 2016-06-11 LAB — HEPATIC FUNCTION PANEL
ALT: 24 U/L (ref 9–46)
AST: 18 U/L (ref 10–35)
Albumin: 4.2 g/dL (ref 3.6–5.1)
Alkaline Phosphatase: 95 U/L (ref 40–115)
Bilirubin, Direct: 0.2 mg/dL (ref <=0.2)
Indirect Bilirubin: 0.5 mg/dL (ref 0.2–1.2)
TOTAL PROTEIN: 5.8 g/dL — AB (ref 6.1–8.1)
Total Bilirubin: 0.7 mg/dL (ref 0.2–1.2)

## 2016-06-13 ENCOUNTER — Other Ambulatory Visit: Payer: Self-pay | Admitting: Physician Assistant

## 2016-06-14 NOTE — Telephone Encounter (Signed)
Rx sent to pharmacy   

## 2016-06-14 NOTE — Telephone Encounter (Signed)
Please advise 

## 2016-06-15 ENCOUNTER — Encounter: Payer: Self-pay | Admitting: *Deleted

## 2016-07-14 ENCOUNTER — Encounter: Payer: Self-pay | Admitting: Cardiovascular Disease

## 2016-07-14 DIAGNOSIS — I213 ST elevation (STEMI) myocardial infarction of unspecified site: Secondary | ICD-10-CM | POA: Diagnosis not present

## 2016-07-14 DIAGNOSIS — Z23 Encounter for immunization: Secondary | ICD-10-CM | POA: Diagnosis not present

## 2016-07-14 DIAGNOSIS — E782 Mixed hyperlipidemia: Secondary | ICD-10-CM | POA: Diagnosis not present

## 2016-07-14 DIAGNOSIS — Z6829 Body mass index (BMI) 29.0-29.9, adult: Secondary | ICD-10-CM | POA: Diagnosis not present

## 2016-07-14 DIAGNOSIS — E119 Type 2 diabetes mellitus without complications: Secondary | ICD-10-CM | POA: Diagnosis not present

## 2016-09-07 ENCOUNTER — Ambulatory Visit (INDEPENDENT_AMBULATORY_CARE_PROVIDER_SITE_OTHER): Payer: BLUE CROSS/BLUE SHIELD | Admitting: Cardiovascular Disease

## 2016-09-07 ENCOUNTER — Encounter: Payer: Self-pay | Admitting: Cardiovascular Disease

## 2016-09-07 VITALS — BP 114/70 | HR 77 | Ht 70.0 in | Wt 196.0 lb

## 2016-09-07 DIAGNOSIS — Z7902 Long term (current) use of antithrombotics/antiplatelets: Secondary | ICD-10-CM

## 2016-09-07 DIAGNOSIS — Z5181 Encounter for therapeutic drug level monitoring: Secondary | ICD-10-CM

## 2016-09-07 MED ORDER — CLOPIDOGREL BISULFATE 75 MG PO TABS: 75.0000 mg | ORAL_TABLET | Freq: Every day | ORAL | 3 refills | Status: DC

## 2016-09-07 NOTE — Patient Instructions (Signed)
Medication Instructions:  Your physician has recommended you make the following change in your medication:  Starting in January: 1- Discontinue your Brilinta.    2- Start Plavix 75 mg (1 tablet) by mouth once a day, after you have stopped the Brilinta.   Labwork: Your physician recommends that you return for lab work in: January, TWO weeks after you have started taking the Plavix once a day.   [facility]. You may present to the main entrance and they will direct you to the registration and lab. If you have any problems, please call our office, [phone].   Follow-Up: We request that you follow-up in: 6 MONTHS WITH LUKE KILROY and in 12 MONTHS with Dr San MorelleBerry  You will receive a reminder letter in the mail two months in advance. If you don't receive a letter, please call our office to schedule the follow-up appointment.   If you need a refill on your cardiac medications before your next appointment, please call your pharmacy.

## 2016-09-07 NOTE — Progress Notes (Signed)
09/07/2016 Roger [name]   [birth year]  [id number]  Primary Physician Roger Rowan, MD Primary Cardiologist: Roger Gess MD Roger [name]  HPI:  Roger [name] is a 65 year old gentleman with a history of anterior STEMI on 11/04/15 treated With direct intervention using a Promus struggling stent. He had noncritical disease otherwise. He did have ventricular fibrillation twice during the case requiring defibrillation but no CPR was performed. He was discharged home 4 days later. 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 A 2-D echo performed 11/11/15 revealed ejection fraction of 40-45% with anteroapical wall motion abnormality and mild to moderate posterior pericardial effusion. He saw Roger [name] who began oral steroids. His symptoms resolved. He was a smoker prior to this and hasn't stopped smoking. I last saw him in the office 12/01/15. Because of recurrent symptoms he underwent stress testing in late April revealing ischemia in the LAD territory and ultimately he underwent recatheterization by Dr. Tresa Endo 03/02/16 revealing aggressive in-stent restenosis within the LAD stent. He underwent angiosculpt  cutting balloon atherectomy successfully. His EF at that time was 40-45% with mild anterolateral hypokinesia which has subsequently improved by 2-D echo performed 05/30/16. Since I saw him 3 months ago he has remained stable denying chest pain or shortness of breath.    Current Outpatient Prescriptions  Medication Sig Dispense Refill  . aspirin EC 81 MG EC tablet Take 1 tablet (81 mg total) by mouth daily. 30 tablet 11  . atorvastatin (LIPITOR) 20 MG tablet Take 1 tablet (20 mg total) by mouth every evening. 90 tablet 3  . Cholecalciferol (VITAMIN D3) 1000 UNITS CAPS Take 2,000 Units by mouth daily.     Marland Kitchen ezetimibe (ZETIA) 10 MG tablet Take 1 tablet (10 mg total) by mouth daily. 90 tablet 3  . isosorbide  mononitrate (IMDUR) 30 MG 24 hr tablet Take 0.5 tablets (15 mg total) by mouth daily. 30 tablet 1  . metoprolol tartrate (LOPRESSOR) 25 MG tablet TAKE 1 TABLET BY MOUTH TWICE DAILY 60 tablet 4  . nitroGLYCERIN (NITROSTAT) 0.4 MG SL tablet Place 1 tablet (0.4 mg total) under the tongue every 5 (five) minutes as needed for chest pain (up to 3 doses). 25 tablet 3  . ticagrelor (BRILINTA) 90 MG TABS tablet Take 1 tablet (90 mg total) by mouth 2 (two) times daily. 180 tablet 3   No current facility-administered medications for this visit.     No Known Allergies  Social History   Social History  . Marital status: Married    Spouse name: N/A  . Number of children: N/A  . Years of education: N/A   Occupational History  . Not on file.   Social History Main Topics  . Smoking status: Former Smoker    Packs/day: 1.00    Years: 50.00    Types: Cigarettes    Quit date: 11/04/2015  . Smokeless tobacco: Never Used  . Alcohol use 1.8 oz/week    1 Glasses of wine, 1 Cans of beer, 1 Shots of liquor per week  . Drug use: No  . Sexual activity: Not Currently     Comment: married   Other Topics Concern  . Not on file   Social History Narrative  . No narrative on file     Review of Systems: General: negative for chills, fever, night sweats or weight changes.  Cardiovascular: negative for chest pain, dyspnea on exertion, edema, orthopnea, palpitations, paroxysmal nocturnal  dyspnea or shortness of breath Dermatological: negative for rash Respiratory: negative for cough or wheezing Urologic: negative for hematuria Abdominal: negative for nausea, vomiting, diarrhea, bright red blood per rectum, melena, or hematemesis Neurologic: negative for visual changes, syncope, or dizziness All other systems reviewed and are otherwise negative except as noted above.    Blood pressure 114/70, pulse 77, height 5\' 10"  (1.778 m), weight 196 lb (88.9 kg).  General appearance: alert and no distress Neck:  no adenopathy, no carotid bruit, no JVD, supple, symmetrical, trachea midline and thyroid not enlarged, symmetric, no tenderness/mass/nodules Lungs: clear to auscultation bilaterally Heart: regular rate and rhythm, S1, S2 normal, no murmur, click, rub or gallop Extremities: extremities normal, atraumatic, no cyanosis or edema  EKG not performed today  ASSESSMENT AND PLAN:   CAD S/P percutaneous coronary angioplasty History of CAD status post anterior STEMI 10/2115 treated with direct intervention with a Promus drug eluding stent. He had noncritical disease otherwise. He did have ventricular defibrillation twice during the case requiring defibrillation although CPR was not performed. His EF was in the 40-45% range with anteroapical wall motion abnormalities. He did have Dressler syndrome post procedure with a pericardial effusion treated medically. This ultimately resolved. Because of recurrent symptoms he underwent stress testing in April of this year revealing ischemia in the LAD territory and ultimately catheterization by Roger [name] 03/02/16 revealing aggressive "in-stent restenosis" than the previously placed LAD stent. He underwent cutting balloon atherectomy successfully. His EF at that time was 40-45% which subsequently normalized by 2-D echocardiogram performed 05/30/16. He is currently asymptomatic on dual antiplatelet therapy. I plan to start to transition him from [place] to Plavix in January of next year. He will need a verify now P2 Y12 platelet aggregation study 2 weeks thereafter.  Hyperlipidemia History of hyperlipidemia on statin therapy with recent lipid profile performed 05/2416 revealing total cholesterol 97, LDL 28 and HDL of 50      Roger GessJonathan J. Cyle Kenyon MD [facility] HospitalFACP,FACC,FAHA, [facility] 09/07/2016 11:18 AM

## 2016-09-07 NOTE — Assessment & Plan Note (Signed)
History of hyperlipidemia on statin therapy with recent lipid profile performed 05/2416 revealing total cholesterol 97, LDL 28 and HDL of 50

## 2016-09-07 NOTE — Assessment & Plan Note (Signed)
History of CAD status post anterior STEMI 10/2115 treated with direct intervention with a Promus drug eluding stent. He had noncritical disease otherwise. He did have ventricular defibrillation twice during the case requiring defibrillation although CPR was not performed. His EF was in the 40-45% range with anteroapical wall motion abnormalities. He did have Dressler syndrome post procedure with a pericardial effusion treated medically. This ultimately resolved. Because of recurrent symptoms he underwent stress testing in April of this year revealing ischemia in the LAD territory and ultimately catheterization by Dr. Tresa EndoKelly 03/02/16 revealing aggressive "in-stent restenosis" than the previously placed LAD stent. He underwent cutting balloon atherectomy successfully. His EF at that time was 40-45% which subsequently normalized by 2-D echocardiogram performed 05/30/16. He is currently asymptomatic on dual antiplatelet therapy. I plan to start to transition him from [place]ilenta to Plavix in January of next year. He will need a verify now P2 Y12 platelet aggregation study 2 weeks thereafter.

## 2016-11-10 ENCOUNTER — Other Ambulatory Visit: Payer: Self-pay | Admitting: Cardiovascular Disease

## 2016-11-10 NOTE — Telephone Encounter (Signed)
REFILL 

## 2016-11-27 IMAGING — NM NM MISC PROCEDURE
6 series · 36 of 36 positions shown · non-contrast
Comparison: none

[Series 1: wbr_r-proj_st wbr rest · 6.40mm/px · 6 of 64 frames shown]
[frame 6/64]
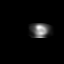
[frame 16/64]
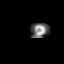
[frame 27/64]
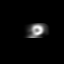
[frame 38/64]
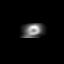
[frame 48/64]
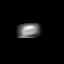
[frame 59/64]
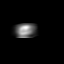

[Series 1: wbr rest · 6.40mm/px · 6 of 64 frames shown]
[frame 6/64]
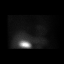
[frame 16/64]
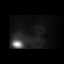
[frame 27/64]
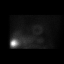
[frame 38/64]
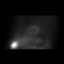
[frame 48/64]
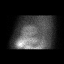
[frame 59/64]
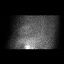

[Series 2: wbr stress-gsp · 6.40mm/px · 6 of 512 frames shown]
[frame 43/512]
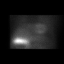
[frame 128/512]
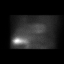
[frame 214/512]
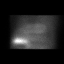
[frame 299/512]
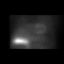
[frame 384/512]
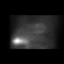
[frame 470/512]
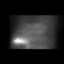

[Series 2: wbr_s-proj_st wbr stress-gsp · 6.40mm/px · 6 of 512 frames shown]
[frame 43/512]
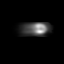
[frame 128/512]
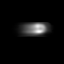
[frame 214/512]
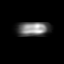
[frame 299/512]
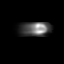
[frame 384/512]
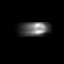
[frame 470/512]
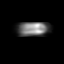

[Series 3: wbr_s-proj_st wbr stress-sum-em · 6.40mm/px · 6 of 64 frames shown]
[frame 6/64]
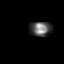
[frame 16/64]
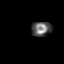
[frame 27/64]
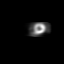
[frame 38/64]
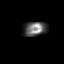
[frame 48/64]
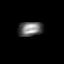
[frame 59/64]
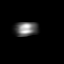

[Series 3: wbr stress-sum-em · 6.40mm/px · 6 of 64 frames shown]
[frame 6/64]
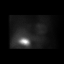
[frame 16/64]
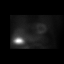
[frame 27/64]
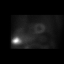
[frame 38/64]
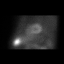
[frame 48/64]
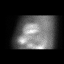
[frame 59/64]
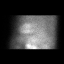

[36 of 36 positions shown; findings below may reference images not displayed]

Canned report from images found in remote index.

Refer to host system for actual result text.

## 2016-12-05 DIAGNOSIS — Z23 Encounter for immunization: Secondary | ICD-10-CM | POA: Diagnosis not present

## 2016-12-24 ENCOUNTER — Other Ambulatory Visit: Payer: Self-pay | Admitting: Cardiology

## 2017-01-03 ENCOUNTER — Other Ambulatory Visit (HOSPITAL_COMMUNITY)
Admission: RE | Admit: 2017-01-03 | Discharge: 2017-01-03 | Disposition: A | Discharge status: Home / Self Care | Payer: BLUE CROSS/BLUE SHIELD | Source: Ambulatory Visit | Attending: Speech Pathology | Admitting: Speech Pathology

## 2017-01-03 DIAGNOSIS — Z7902 Long term (current) use of antithrombotics/antiplatelets: Secondary | ICD-10-CM | POA: Insufficient documentation

## 2017-01-03 DIAGNOSIS — Z5181 Encounter for therapeutic drug level monitoring: Secondary | ICD-10-CM | POA: Diagnosis not present

## 2017-01-03 LAB — PLATELET INHIBITION P2Y12: Platelet Function  P2Y12: 105 [PRU] — ABNORMAL LOW (ref 194–418)

## 2017-03-13 ENCOUNTER — Telehealth: Payer: Self-pay | Admitting: Cardiovascular Disease

## 2017-03-13 NOTE — Telephone Encounter (Signed)
Spoke with patient and he was due for colonoscopy last year but was not able to do secondary to having stent in 10/2015 and need for uninterrupted drug therapy for 1 year. Patient is now needing to have colonoscopy with Dr Evette Cristal and would like to know Dr Hazle Coca recommendations for holding Plavix.  He is scheduled for follow up with [facility] 03/29/17 Will forward to Dr Allyson Sabal for review

## 2017-03-13 NOTE — Telephone Encounter (Signed)
Patient calling, states that it is time for him to have a colonoscopy and he is currently on blood thinners. Patient would like to discuss the best way to go about having this procedure completed. Thanks.

## 2017-03-14 ENCOUNTER — Other Ambulatory Visit: Payer: Self-pay | Admitting: *Deleted

## 2017-03-14 ENCOUNTER — Other Ambulatory Visit: Payer: Self-pay

## 2017-03-14 MED ORDER — ATORVASTATIN CALCIUM 20 MG PO TABS: 20.0000 mg | ORAL_TABLET | Freq: Every evening | ORAL | 0 refills | Status: DC

## 2017-03-14 MED ORDER — EZETIMIBE 10 MG PO TABS: 10.0000 mg | ORAL_TABLET | Freq: Every day | ORAL | 0 refills | Status: DC

## 2017-03-15 NOTE — Telephone Encounter (Signed)
HOW LONG BEFORE COLONOSCOPY?

## 2017-03-15 NOTE — Telephone Encounter (Signed)
[id number] 

## 2017-03-15 NOTE — Telephone Encounter (Signed)
Okay to hold Plavix for colonoscopy 

## 2017-03-15 NOTE — Telephone Encounter (Signed)
Hold for 7 days.  

## 2017-03-16 NOTE — Telephone Encounter (Signed)
Pt notified to stop 7 days prior to colonoscopy and will ask Md doing the colonscopy when to restart again. Will call back with any further questions.

## 2017-03-29 ENCOUNTER — Encounter: Payer: Self-pay | Admitting: Cardiology

## 2017-03-29 ENCOUNTER — Ambulatory Visit (INDEPENDENT_AMBULATORY_CARE_PROVIDER_SITE_OTHER): Payer: Medicare Other | Admitting: Cardiology

## 2017-03-29 DIAGNOSIS — I255 Ischemic cardiomyopathy: Secondary | ICD-10-CM | POA: Diagnosis not present

## 2017-03-29 DIAGNOSIS — E785 Hyperlipidemia, unspecified: Secondary | ICD-10-CM | POA: Diagnosis not present

## 2017-03-29 DIAGNOSIS — I241 Dressler's syndrome: Secondary | ICD-10-CM

## 2017-03-29 DIAGNOSIS — I252 Old myocardial infarction: Secondary | ICD-10-CM

## 2017-03-29 DIAGNOSIS — I2510 Atherosclerotic heart disease of native coronary artery without angina pectoris: Secondary | ICD-10-CM

## 2017-03-29 DIAGNOSIS — Z9861 Coronary angioplasty status: Secondary | ICD-10-CM

## 2017-03-29 NOTE — Assessment & Plan Note (Signed)
LDL 10 June 2016

## 2017-03-29 NOTE — Assessment & Plan Note (Signed)
Post MI- Rx'd with steroids 

## 2017-03-29 NOTE — Progress Notes (Signed)
03/29/2017 Ardelle ParkJeffrey M Grimes   11-08-1951  [id number]  Primary Physician Lewis Moccasinewey, Elizabeth R, MD Primary Cardiologist: Dr Allyson SabalBerry  HPI:  [id number] y.o. male with a history of anterior STEMI on 11/04/15 treated with direct intervention using a Promus stent. He had moderate noncritical disease otherwise. He did have ventricular fibrillation twice during the case requiring defibrillation but no CPR was required. He was discharged home 4 days later. 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 A 2-D echo performed 11/11/15 revealed ejection fraction of 40-45% with anteroapical wall motion abnormality and mild to moderate posterior pericardial effusion. He was treated with oral steroids and his symptoms resolved. He was seen in the office 02/16/16 and complained of chest pain while at cardiac rehab. Myoview done then as an OP was abnormal and he was admitted for cath 4/191/17. This revealed ISR of the previously placed Promus DES. He was treated with PCI (Angiosculpt scoring balloon and noncompliant balloon) with good results.           He then presented to the ED after a syncopal spell 04/21/16. He may have been mildly dehydrated. Overnight telemetry did not show any arrhythmia. Echo done 04/22/16 showed his EF to be 45-50% and an echo in July 2017 showed his EF to be 55-60%. He is in the office today for follow up. He has not had any recurrent syncope or near syncope or chest pain.    Current Outpatient Prescriptions  Medication Sig Dispense Refill  . aspirin EC 81 MG EC tablet Take 1 tablet (81 mg total) by mouth daily. 30 tablet 11  . atorvastatin (LIPITOR) 20 MG tablet Take 1 tablet (20 mg total) by mouth every evening. 30 tablet 0  . Cholecalciferol (VITAMIN D3) 1000 UNITS CAPS Take 2,000 Units by mouth daily.     . clopidogrel (PLAVIX) 75 MG tablet Take 1 tablet (75 mg total) by mouth daily. Start taking in January after you have stopped your Brilinta. 90 tablet 3  . ezetimibe (ZETIA) 10 MG tablet  Take 1 tablet (10 mg total) by mouth daily. 90 tablet 0  . isosorbide mononitrate (IMDUR) 30 MG 24 hr tablet Take 0.5 tablets (15 mg total) by mouth daily. 30 tablet 1  . metoprolol tartrate (LOPRESSOR) 25 MG tablet TAKE ONE TABLET BY MOUTH TWICE A DAY 60 tablet 11  . NITROSTAT 0.4 MG SL tablet PLACE 1 TABLET (0.4 MG TOTAL) UNDER THE TONGUE EVERY 5 (FIVE) MINUTESAS NEEDED FOR CHEST PAIN (UP TO 3 DOSES). 25 tablet 2   No current facility-administered medications for this visit.     No Known Allergies  Past Medical History:  Diagnosis Date  . Anginal pain (HCC)    "since 11/04/2015" (03/02/2016)  . Arthritis    "back" (03/02/2016)  . CAD (coronary artery disease)    a. Ant STEMI 10/2015 - s/p DES to LAD, residual RCA disease being treated medically.   Marland Kitchen. CAD S/P percutaneous coronary angioplasty 11/04/2015   a. Ant STEMI 10/2015 - s/p DES to LAD , (Promus Premier DES 2.75 mm x 16 mm; 3.3 mm), residual ~50+% ulcerated RCA disease being treated medically.   . Chronic combined systolic and diastolic CHF (congestive heart failure) (HCC)    a. 2D echo 10/2015 at time of MI: EF 45-50%, grade 1 DD.  Marland Kitchen. Chronic lower back pain   . Colon polyp   . 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 Headache    "sometimes daily; sometimes  less often; no much now" (03/02/2016)  . Hyperglycemia    a. 10/2015 - A1C 6.5.  . Hyperlipidemia with target LDL less than 70   . Ischemic cardiomyopathy    a. 12/22 Echo: Post anterior STEMI EF 45-50%, followup echo 11/11/15 EF 40-45%.  . Kidney calculus    "passed"  . Pneumonia 2016  . S/P angioplasty with stent 03/02/16 mLAD angioplasty  03/03/2016  . ST elevation (STEMI) myocardial infarction involving left anterior descending coronary artery (HCC) 11/04/2015   LAD 100%,   . Sustained VT (ventricular tachycardia) (HCC)    a. Ischemic -- Had VT-VF during Cath for Anterior STEMI - Defib x 2  . Syncope and collapse 04/2016  . Tobacco abuse      Social History   Social History  . Marital status: Married    Spouse name: N/A  . Number of children: N/A  . Years of education: N/A   Occupational History  . Not on file.   Social History Main Topics  . Smoking status: Former Smoker    Packs/day: 1.00    Years: 50.00    Types: Cigarettes    Quit date: 11/04/2015  . Smokeless tobacco: Never Used  . Alcohol use 1.8 oz/week    1 Glasses of wine, 1 Cans of beer, 1 Shots of liquor per week  . Drug use: No  . Sexual activity: Not Currently     Comment: married   Other Topics Concern  . Not on file   Social History Narrative  . No narrative on file     Family History  Problem Relation Age of Onset  . Aneurysm Mother   . Dementia Father   . Dystonia Sister      Review of Systems: General: negative for chills, fever, night sweats or weight changes.  Cardiovascular: negative for chest pain, dyspnea on exertion, edema, orthopnea, palpitations, paroxysmal nocturnal dyspnea or shortness of breath Dermatological: negative for rash Respiratory: negative for cough or wheezing Urologic: negative for hematuria Abdominal: negative for nausea, vomiting, diarrhea, bright red blood per rectum, melena, or hematemesis Neurologic: negative for visual changes, syncope, or dizziness All other systems reviewed and are otherwise negative except as noted above.    Blood pressure 124/72, pulse 81, height 5\' 10"  (1.778 m), weight 203 lb (92.1 kg).  General appearance: alert, cooperative and no distress Neck: no carotid bruit and no JVD Lungs: clear to auscultation bilaterally Heart: regular rate and rhythm Extremities: extremities normal, atraumatic, no cyanosis or edema Skin: Skin color, texture, turgor normal. No rashes or lesions Neurologic: Grossly normal  EKG NSR  ASSESSMENT AND PLAN:   History of ST elevation myocardial infarction (STEMI) Anterior STEMI Dec 2016- PCI complicated by VF requiring cardioversion  CAD S/P  percutaneous coronary angioplasty LAD DES Dec 2016 in setting of STEMI- with ISR treated with re do PCI- after he had chest pain and an abnormal Myoview April 2017  Cardiomyopathy, ischemic EF 45-50% by echo June 2017,  55-60% July 2017  Dressler's syndrome [facility]) Post MI- Rx'd with steroids  Dyslipidemia LDL 10 June 2016   PLAN  Same Rx. He wanted to know about holding Plavix for a colonoscopy and I told him that would be fine. He should probably hold it for 7 days pre op and resume when safe post op.   Corine Shelter PA-C 03/29/2017 3:51 PM

## 2017-03-29 NOTE — Assessment & Plan Note (Signed)
EF 45-50% by echo June 2017,  55-60% July 2017

## 2017-03-29 NOTE — Assessment & Plan Note (Signed)
LAD DES Dec 2016 in setting of STEMI- with ISR treated with re do PCI- after he had chest pain and an abnormal Myoview April 2017

## 2017-03-29 NOTE — Patient Instructions (Signed)
Your physician recommends that you schedule a follow-up appointment in:6 months with Dr Berry 

## 2017-03-29 NOTE — Assessment & Plan Note (Signed)
Anterior STEMI Dec 2016- PCI complicated by VF requiring cardioversion

## 2017-04-06 DIAGNOSIS — Z6829 Body mass index (BMI) 29.0-29.9, adult: Secondary | ICD-10-CM | POA: Diagnosis not present

## 2017-04-06 DIAGNOSIS — Z1159 Encounter for screening for other viral diseases: Secondary | ICD-10-CM | POA: Diagnosis not present

## 2017-04-06 DIAGNOSIS — Z139 Encounter for screening, unspecified: Secondary | ICD-10-CM | POA: Diagnosis not present

## 2017-04-06 DIAGNOSIS — Z23 Encounter for immunization: Secondary | ICD-10-CM | POA: Diagnosis not present

## 2017-04-06 DIAGNOSIS — E782 Mixed hyperlipidemia: Secondary | ICD-10-CM | POA: Diagnosis not present

## 2017-04-06 DIAGNOSIS — Z1211 Encounter for screening for malignant neoplasm of colon: Secondary | ICD-10-CM | POA: Diagnosis not present

## 2017-04-06 DIAGNOSIS — I2510 Atherosclerotic heart disease of native coronary artery without angina pectoris: Secondary | ICD-10-CM | POA: Diagnosis not present

## 2017-04-06 DIAGNOSIS — I213 ST elevation (STEMI) myocardial infarction of unspecified site: Secondary | ICD-10-CM | POA: Diagnosis not present

## 2017-04-14 ENCOUNTER — Telehealth: Payer: Self-pay | Admitting: Cardiovascular Disease

## 2017-04-14 NOTE — Telephone Encounter (Signed)
Requesting surgical clearance:  1. Type of surgery: Colonoscopy  2. Surgeon: Dr. Herbert MoorsSalem Ganem  3.Surgical Date:  07/03/17  4. Medications that need to be held: Plavix for 5 days and ASA 81 for 7 days   5. CAD: Yes  6. I will defer to:  Dr. Conception OmsBerry   Contact Information:  [facility] Phone:  [phone] Fax:  [phone]

## 2017-04-25 NOTE — Telephone Encounter (Signed)
Okay to interrupt antiplatelet therapy for colonoscopy 

## 2017-04-27 NOTE — Telephone Encounter (Signed)
Clearance faxed via Epic to # provided.  

## 2017-05-23 ENCOUNTER — Ambulatory Visit
Admission: RE | Admit: 2017-05-23 | Discharge: 2017-05-23 | Disposition: A | Discharge status: Home / Self Care | Payer: BLUE CROSS/BLUE SHIELD | Source: Ambulatory Visit | Attending: Family Medicine | Admitting: Family Medicine

## 2017-05-23 ENCOUNTER — Other Ambulatory Visit: Payer: Self-pay | Admitting: Family Medicine

## 2017-05-23 DIAGNOSIS — Z6828 Body mass index (BMI) 28.0-28.9, adult: Secondary | ICD-10-CM | POA: Diagnosis not present

## 2017-05-23 DIAGNOSIS — M79604 Pain in right leg: Secondary | ICD-10-CM | POA: Diagnosis not present

## 2017-05-23 DIAGNOSIS — M25569 Pain in unspecified knee: Secondary | ICD-10-CM

## 2017-05-23 DIAGNOSIS — M25561 Pain in right knee: Secondary | ICD-10-CM | POA: Diagnosis not present

## 2017-05-23 DIAGNOSIS — M25559 Pain in unspecified hip: Secondary | ICD-10-CM

## 2017-05-23 DIAGNOSIS — M1711 Unilateral primary osteoarthritis, right knee: Secondary | ICD-10-CM | POA: Diagnosis not present

## 2017-05-23 DIAGNOSIS — M1611 Unilateral primary osteoarthritis, right hip: Secondary | ICD-10-CM | POA: Diagnosis not present

## 2017-05-23 DIAGNOSIS — M1712 Unilateral primary osteoarthritis, left knee: Secondary | ICD-10-CM | POA: Diagnosis not present

## 2017-06-06 DIAGNOSIS — I213 ST elevation (STEMI) myocardial infarction of unspecified site: Secondary | ICD-10-CM | POA: Diagnosis not present

## 2017-06-06 DIAGNOSIS — M5431 Sciatica, right side: Secondary | ICD-10-CM | POA: Diagnosis not present

## 2017-06-06 DIAGNOSIS — R7303 Prediabetes: Secondary | ICD-10-CM | POA: Diagnosis not present

## 2017-06-06 DIAGNOSIS — I2510 Atherosclerotic heart disease of native coronary artery without angina pectoris: Secondary | ICD-10-CM | POA: Diagnosis not present

## 2017-06-11 ENCOUNTER — Other Ambulatory Visit: Payer: Self-pay | Admitting: Cardiovascular Disease

## 2017-06-11 ENCOUNTER — Other Ambulatory Visit: Payer: Self-pay | Admitting: Cardiology

## 2017-06-12 MED ORDER — EZETIMIBE 10 MG PO TABS: 10.0000 mg | ORAL_TABLET | Freq: Every day | ORAL | 1 refills | Status: DC

## 2017-06-14 ENCOUNTER — Other Ambulatory Visit: Payer: Self-pay | Admitting: Cardiology

## 2017-06-28 ENCOUNTER — Other Ambulatory Visit: Payer: Self-pay | Admitting: Family Medicine

## 2017-06-28 DIAGNOSIS — M5415 Radiculopathy, thoracolumbar region: Secondary | ICD-10-CM

## 2017-07-03 DIAGNOSIS — Z86010 Personal history of colonic polyps: Secondary | ICD-10-CM | POA: Diagnosis not present

## 2017-07-03 DIAGNOSIS — K5730 Diverticulosis of large intestine without perforation or abscess without bleeding: Secondary | ICD-10-CM | POA: Diagnosis not present

## 2017-07-04 ENCOUNTER — Ambulatory Visit
Admission: RE | Admit: 2017-07-04 | Discharge: 2017-07-04 | Disposition: A | Discharge status: Home / Self Care | Payer: Medicare Other | Source: Ambulatory Visit | Attending: Family Medicine | Admitting: Family Medicine

## 2017-07-04 DIAGNOSIS — M5415 Radiculopathy, thoracolumbar region: Secondary | ICD-10-CM

## 2017-07-04 DIAGNOSIS — M5124 Other intervertebral disc displacement, thoracic region: Secondary | ICD-10-CM | POA: Diagnosis not present

## 2017-07-04 DIAGNOSIS — M5126 Other intervertebral disc displacement, lumbar region: Secondary | ICD-10-CM | POA: Diagnosis not present

## 2017-08-09 DIAGNOSIS — R030 Elevated blood-pressure reading, without diagnosis of hypertension: Secondary | ICD-10-CM | POA: Diagnosis not present

## 2017-08-09 DIAGNOSIS — Z6828 Body mass index (BMI) 28.0-28.9, adult: Secondary | ICD-10-CM | POA: Diagnosis not present

## 2017-08-09 DIAGNOSIS — M5416 Radiculopathy, lumbar region: Secondary | ICD-10-CM | POA: Diagnosis not present

## 2017-08-09 DIAGNOSIS — M4317 Spondylolisthesis, lumbosacral region: Secondary | ICD-10-CM | POA: Diagnosis not present

## 2017-09-07 ENCOUNTER — Telehealth: Payer: Self-pay | Admitting: Cardiovascular Disease

## 2017-09-07 NOTE — Telephone Encounter (Signed)
Received records from Dr Maryelizabeth RowanElizabeth Dewey for appointment on 09/27/17 with Dr Allyson SabalBerry.  Records put with Dr Hazle CocaBerry's schedule for 09/27/17. lp

## 2017-09-15 ENCOUNTER — Other Ambulatory Visit: Payer: Self-pay | Admitting: Cardiovascular Disease

## 2017-09-15 DIAGNOSIS — Z7902 Long term (current) use of antithrombotics/antiplatelets: Principal | ICD-10-CM

## 2017-09-15 DIAGNOSIS — Z5181 Encounter for therapeutic drug level monitoring: Secondary | ICD-10-CM

## 2017-09-15 NOTE — Telephone Encounter (Signed)
Rx(s) sent to pharmacy electronically.  

## 2017-09-27 ENCOUNTER — Encounter: Payer: Self-pay | Admitting: Cardiovascular Disease

## 2017-09-27 ENCOUNTER — Ambulatory Visit (INDEPENDENT_AMBULATORY_CARE_PROVIDER_SITE_OTHER): Payer: Medicare Other | Admitting: Cardiovascular Disease

## 2017-09-27 VITALS — BP 130/74 | HR 75 | Ht 70.0 in | Wt 201.6 lb

## 2017-09-27 DIAGNOSIS — Z9861 Coronary angioplasty status: Secondary | ICD-10-CM | POA: Diagnosis not present

## 2017-09-27 DIAGNOSIS — I2510 Atherosclerotic heart disease of native coronary artery without angina pectoris: Secondary | ICD-10-CM

## 2017-09-27 DIAGNOSIS — E785 Hyperlipidemia, unspecified: Secondary | ICD-10-CM | POA: Diagnosis not present

## 2017-09-27 DIAGNOSIS — I252 Old myocardial infarction: Secondary | ICD-10-CM

## 2017-09-27 DIAGNOSIS — I241 Dressler's syndrome: Secondary | ICD-10-CM

## 2017-09-27 NOTE — Progress Notes (Signed)
09/27/2017 Roger [name]   18-Aug-1951  [id number]  Primary Physician Lewis Moccasinewey, Elizabeth R, MD Primary Cardiologist: Runell GessJonathan J Chayah Mckee MD FACP, [place]ACC, [place], [place]  HPI:  Roger [name] is a 66 y.o.  gentleman with a history of anterior STEMI on 11/04/15 treated With direct intervention using a Promus drug eluting stent. He had noncritical disease otherwise. He did have ventricular fibrillation twice during the case requiring defibrillation but no CPR was performed. He was discharged home 4 days later. 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 A 2-D echo performed 11/11/15 revealed ejection fraction of 40-45% with anteroapical wall motion abnormality and mild to moderate posterior pericardial effusion. He saw Dr. Herbie BaltimoreHarding who began oral steroids. His symptoms resolved. He was a smoker prior to this and hasn't stopped smoking. I last saw him in the office 12/01/15. Because of recurrent symptoms he underwent stress testing in late April revealing ischemia in the LAD territory and ultimately he underwent recatheterization by Dr. Tresa EndoKelly 03/02/16 revealing aggressive in-stent restenosis within the LAD stent. He underwent angiosculpt cutting balloon atherectomy successfully. His EF at that time was 40-45% with mild anterolateral hypokinesia which has subsequently improved by 2-D echo performed 05/30/16.  Since I saw him a year ago he's remained stable. He denies chest pain or shortness of breath.     Current Meds  Medication Sig  . aspirin EC 81 MG EC tablet Take 1 tablet (81 mg total) by mouth daily.  Marland Kitchen. atorvastatin (LIPITOR) 20 MG tablet TAKE ONE TABLET BY MOUTH EVERY EVENING  . Cholecalciferol (VITAMIN D3) 1000 UNITS CAPS Take 2,000 Units by mouth daily.   . clopidogrel (PLAVIX) 75 MG tablet Take 1 tablet (75 mg total) by mouth daily.  Marland Kitchen. ezetimibe (ZETIA) 10 MG tablet Take 1 tablet (10 mg total) by mouth daily.  .  metoprolol tartrate (LOPRESSOR) 25 MG tablet Take 1 tablet (25 mg total) by mouth 2 (two) times daily.  Marland Kitchen. NITROSTAT 0.4 MG SL tablet PLACE 1 TABLET (0.4 MG TOTAL) UNDER THE TONGUE EVERY 5 (FIVE) MINUTESAS NEEDED FOR CHEST PAIN (UP TO 3 DOSES).     No Known Allergies  Social History   Socioeconomic History  . Marital status: Married    Spouse name: Not on file  . Number of children: Not on file  . Years of education: Not on file  . Highest education level: Not on file  Social Needs  . Financial resource strain: Not on file  . Food insecurity - worry: Not on file  . Food insecurity - inability: Not on file  . Transportation needs - medical: Not on file  . Transportation needs - non-medical: Not on file  Occupational History  . Not on file  Tobacco Use  . Smoking status: Former Smoker    Packs/day: 1.00    Years: 50.00    Pack years: 50.00    Types: Cigarettes    Last attempt to quit: 11/04/2015    Years since quitting: 1.8  . Smokeless tobacco: Never Used  Substance and Sexual Activity  . Alcohol use: Yes    Alcohol/week: 1.8 oz    Types: 1 Glasses of wine, 1 Cans of beer, 1 Shots of liquor per week  . Drug use: No  . Sexual activity: Not Currently    Comment: married  Other Topics Concern  . Not on file  Social History Narrative  . Not on file  Review of Systems: General: negative for chills, fever, night sweats or weight changes.  Cardiovascular: negative for chest pain, dyspnea on exertion, edema, orthopnea, palpitations, paroxysmal nocturnal dyspnea or shortness of breath Dermatological: negative for rash Respiratory: negative for cough or wheezing Urologic: negative for hematuria Abdominal: negative for nausea, vomiting, diarrhea, bright red blood per rectum, melena, or hematemesis Neurologic: negative for visual changes, syncope, or dizziness All other systems reviewed and are otherwise negative except as noted above.    Blood pressure 130/74, pulse 75,  height 5\' 10"  (1.778 m), weight 201 lb 9.6 oz (91.4 kg).  General appearance: alert and no distress Neck: no adenopathy, no carotid bruit, no JVD, supple, symmetrical, trachea midline and thyroid not enlarged, symmetric, no tenderness/mass/nodules Lungs: clear to auscultation bilaterally Heart: regular rate and rhythm, S1, S2 normal, no murmur, click, rub or gallop Extremities: extremities normal, atraumatic, no cyanosis or edema Pulses: 2+ and symmetric Skin: Skin color, texture, turgor normal. No rashes or lesions Neurologic: Alert and oriented X 3, normal strength and tone. Normal symmetric reflexes. Normal coordination and gait  EKG sinus rhythm at 75 with septal Q waves. I personally reviewed this EKG.  ASSESSMENT AND PLAN:   CAD S/P percutaneous coronary angioplasty History of CAD status post LAD stenting by myself with a Promus drug eluding stent 11/04/15 in the setting of an anterior STEMI. He did have atrial fibrillation twice requiring defibrillation but no CPR was performed. He had repeat cardiac catheterization 03/02/16 by Dr. Tresa EndoKelly revealing aggressive in-stent restenosis within the LAD stent and underwent angiosculpt  cutting balloon atherectomy successfully. His EF eventually improved from the 40-45% range up to 55-60% by his most recent 2-D echo 05/30/16. He currently denies chest pain or shortness of breath.  Dyslipidemia History of dyslipidemia on Zetia and low-dose atorvastatin followed by his PCP.      Runell GessJonathan J. Edye Hainline MD FACP,FACC,FAHA, Hemphill County HospitalFSCAI 09/27/2017 1:48 PM

## 2017-09-27 NOTE — Patient Instructions (Signed)

## 2017-09-27 NOTE — Assessment & Plan Note (Signed)
History of CAD status post LAD stenting by myself with a Promus drug eluding stent 11/04/15 in the setting of an anterior STEMI. He did have atrial fibrillation twice requiring defibrillation but no CPR was performed. He had repeat cardiac catheterization 03/02/16 by Dr. Tresa EndoKelly revealing aggressive in-stent restenosis within the LAD stent and underwent angiosculpt  cutting balloon atherectomy successfully. His EF eventually improved from the 40-45% range up to 55-60% by his most recent 2-D echo 05/30/16. He currently denies chest pain or shortness of breath.

## 2017-09-27 NOTE — Assessment & Plan Note (Signed)
History of dyslipidemia on Zetia and low-dose atorvastatin followed by his PCP.

## 2017-10-02 DIAGNOSIS — Z125 Encounter for screening for malignant neoplasm of prostate: Secondary | ICD-10-CM | POA: Diagnosis not present

## 2017-10-04 DIAGNOSIS — M5416 Radiculopathy, lumbar region: Secondary | ICD-10-CM | POA: Diagnosis not present

## 2017-10-04 DIAGNOSIS — M4317 Spondylolisthesis, lumbosacral region: Secondary | ICD-10-CM | POA: Diagnosis not present

## 2017-10-10 DIAGNOSIS — Z6829 Body mass index (BMI) 29.0-29.9, adult: Secondary | ICD-10-CM | POA: Diagnosis not present

## 2017-10-10 DIAGNOSIS — Z1211 Encounter for screening for malignant neoplasm of colon: Secondary | ICD-10-CM | POA: Diagnosis not present

## 2017-10-10 DIAGNOSIS — Z23 Encounter for immunization: Secondary | ICD-10-CM | POA: Diagnosis not present

## 2017-10-10 DIAGNOSIS — Z0000 Encounter for general adult medical examination without abnormal findings: Secondary | ICD-10-CM | POA: Diagnosis not present

## 2017-10-12 DIAGNOSIS — M4317 Spondylolisthesis, lumbosacral region: Secondary | ICD-10-CM | POA: Diagnosis not present

## 2017-10-17 DIAGNOSIS — M4317 Spondylolisthesis, lumbosacral region: Secondary | ICD-10-CM | POA: Diagnosis not present

## 2017-10-19 DIAGNOSIS — M4317 Spondylolisthesis, lumbosacral region: Secondary | ICD-10-CM | POA: Diagnosis not present

## 2017-10-24 DIAGNOSIS — M4317 Spondylolisthesis, lumbosacral region: Secondary | ICD-10-CM | POA: Diagnosis not present

## 2017-10-26 DIAGNOSIS — M4317 Spondylolisthesis, lumbosacral region: Secondary | ICD-10-CM | POA: Diagnosis not present

## 2017-10-31 DIAGNOSIS — M4317 Spondylolisthesis, lumbosacral region: Secondary | ICD-10-CM | POA: Diagnosis not present

## 2017-11-02 DIAGNOSIS — M4317 Spondylolisthesis, lumbosacral region: Secondary | ICD-10-CM | POA: Diagnosis not present

## 2017-11-09 DIAGNOSIS — M4317 Spondylolisthesis, lumbosacral region: Secondary | ICD-10-CM | POA: Diagnosis not present

## 2017-11-15 DIAGNOSIS — M4317 Spondylolisthesis, lumbosacral region: Secondary | ICD-10-CM | POA: Diagnosis not present

## 2017-11-15 DIAGNOSIS — M5416 Radiculopathy, lumbar region: Secondary | ICD-10-CM | POA: Diagnosis not present

## 2017-11-20 DIAGNOSIS — M4317 Spondylolisthesis, lumbosacral region: Secondary | ICD-10-CM | POA: Diagnosis not present

## 2017-11-24 DIAGNOSIS — Z79899 Other long term (current) drug therapy: Secondary | ICD-10-CM | POA: Diagnosis not present

## 2017-11-24 DIAGNOSIS — E119 Type 2 diabetes mellitus without complications: Secondary | ICD-10-CM | POA: Diagnosis not present

## 2017-11-24 DIAGNOSIS — E782 Mixed hyperlipidemia: Secondary | ICD-10-CM | POA: Diagnosis not present

## 2017-11-24 DIAGNOSIS — E559 Vitamin D deficiency, unspecified: Secondary | ICD-10-CM | POA: Diagnosis not present

## 2017-11-27 ENCOUNTER — Other Ambulatory Visit: Payer: Self-pay

## 2017-11-27 DIAGNOSIS — M4317 Spondylolisthesis, lumbosacral region: Secondary | ICD-10-CM | POA: Diagnosis not present

## 2017-11-27 MED ORDER — ATORVASTATIN CALCIUM 20 MG PO TABS: 20.0000 mg | ORAL_TABLET | Freq: Every evening | ORAL | 3 refills | Status: DC

## 2017-11-27 MED ORDER — EZETIMIBE 10 MG PO TABS: 10.0000 mg | ORAL_TABLET | Freq: Every day | ORAL | 3 refills | Status: DC

## 2017-11-27 NOTE — Telephone Encounter (Signed)
Rx(s) sent to pharmacy electronically.  

## 2017-12-04 DIAGNOSIS — M4317 Spondylolisthesis, lumbosacral region: Secondary | ICD-10-CM | POA: Diagnosis not present

## 2017-12-11 DIAGNOSIS — M4317 Spondylolisthesis, lumbosacral region: Secondary | ICD-10-CM | POA: Diagnosis not present

## 2017-12-11 DIAGNOSIS — R739 Hyperglycemia, unspecified: Secondary | ICD-10-CM | POA: Diagnosis not present

## 2017-12-11 DIAGNOSIS — Z6829 Body mass index (BMI) 29.0-29.9, adult: Secondary | ICD-10-CM | POA: Diagnosis not present

## 2017-12-11 DIAGNOSIS — E782 Mixed hyperlipidemia: Secondary | ICD-10-CM | POA: Diagnosis not present

## 2017-12-11 DIAGNOSIS — E559 Vitamin D deficiency, unspecified: Secondary | ICD-10-CM | POA: Diagnosis not present

## 2017-12-18 DIAGNOSIS — M4317 Spondylolisthesis, lumbosacral region: Secondary | ICD-10-CM | POA: Diagnosis not present

## 2017-12-20 ENCOUNTER — Telehealth: Payer: Self-pay | Admitting: *Deleted

## 2017-12-20 ENCOUNTER — Other Ambulatory Visit: Payer: Self-pay | Admitting: Neurological Surgery

## 2017-12-20 DIAGNOSIS — Z6829 Body mass index (BMI) 29.0-29.9, adult: Secondary | ICD-10-CM | POA: Diagnosis not present

## 2017-12-20 DIAGNOSIS — M4317 Spondylolisthesis, lumbosacral region: Secondary | ICD-10-CM | POA: Diagnosis not present

## 2017-12-20 DIAGNOSIS — M5416 Radiculopathy, lumbar region: Secondary | ICD-10-CM | POA: Diagnosis not present

## 2017-12-20 NOTE — Telephone Encounter (Signed)
   [facility] Pre-operative Risk Assessment    Request for surgical clearance:  1. What type of surgery is being performed? Right L5-S1 Laminoforaminotomy with Facetectomy   2. When is this surgery scheduled? 2/28   3. What type of clearance is required (medical clearance vs. Pharmacy clearance to hold med vs. Both)? both  4. Are there any medications that need to be held prior to surgery and how long?Plavix, requested for 10 days prior but to stay on aspirin   5. Practice name and name of physician performing surgery? [facility]   6. What is your office phone and fax number? [phone] Fax: [phone]   7. Anesthesia type (None, local, MAC, general) ? None listed   Ricci Barker 12/20/2017, 5:45 PM  _________________________________________________________________   (provider comments below)

## 2017-12-21 NOTE — Telephone Encounter (Signed)
Dr. Allyson SabalBerry, can you please comment on the surgeon's request to hold plavix for 10 days prior to procedure.   Route back to P CV DIV PREOP pool.

## 2017-12-25 NOTE — Telephone Encounter (Signed)
   Primary Cardiologist: Dr. Allyson SabalBerry  Chart reviewed as part of pre-operative protocol coverage. Given past medical history and time since last visit, based on ACC/AHA guidelines, Roger [name] would be at acceptable risk for the planned procedure without further cardiovascular testing. He denies anginal symptoms. He can complete 4 Mets w/o exertional CP or dyspnea (walk up a flight of stairs). He can be cleared for surgery w/o need for cardiac testing.  Per Dr. Allyson SabalBerry, ok for Roger [name] to hold Plavix 10 days prior to neurosurgery (see his comments below).  I will route this recommendation to the requesting party via Epic fax function and remove from pre-op pool.  Please call with questions.  [name], PA-C 12/25/2017, 2:02 PM

## 2017-12-25 NOTE — Telephone Encounter (Signed)
Okay to interrupt his antiplatelet therapy for his neurosurgical procedure

## 2018-01-05 ENCOUNTER — Encounter (HOSPITAL_COMMUNITY)
Admission: RE | Admit: 2018-01-05 | Discharge: 2018-01-05 | Disposition: A | Discharge status: Home / Self Care | Payer: Medicare Other | Source: Ambulatory Visit | Attending: Neurological Surgery | Admitting: Neurological Surgery

## 2018-01-05 DIAGNOSIS — Z01818 Encounter for other preprocedural examination: Secondary | ICD-10-CM | POA: Diagnosis not present

## 2018-01-05 LAB — SURGICAL PCR SCREEN
MRSA, PCR: NEGATIVE
STAPHYLOCOCCUS AUREUS: NEGATIVE

## 2018-01-05 LAB — COMPREHENSIVE METABOLIC PANEL
ALK PHOS: 109 U/L (ref 38–126)
ALT: 23 U/L (ref 17–63)
ANION GAP: 9 (ref 5–15)
AST: 22 U/L (ref 15–41)
Albumin: 4.1 g/dL (ref 3.5–5.0)
BILIRUBIN TOTAL: 0.5 mg/dL (ref 0.3–1.2)
BUN: 19 mg/dL (ref 6–20)
CALCIUM: 9.2 mg/dL (ref 8.9–10.3)
CO2: 25 mmol/L (ref 22–32)
Chloride: 108 mmol/L (ref 101–111)
Creatinine, Ser: 1.07 mg/dL (ref 0.61–1.24)
Glucose, Bld: 126 mg/dL — ABNORMAL HIGH (ref 65–99)
Potassium: 4.4 mmol/L (ref 3.5–5.1)
Sodium: 142 mmol/L (ref 135–145)
TOTAL PROTEIN: 6.2 g/dL — AB (ref 6.5–8.1)

## 2018-01-05 LAB — CBC
HCT: 41 % (ref 39.0–52.0)
Hemoglobin: 13.5 g/dL (ref 13.0–17.0)
MCH: 31.7 pg (ref 26.0–34.0)
MCHC: 32.9 g/dL (ref 30.0–36.0)
MCV: 96.2 fL (ref 78.0–100.0)
Platelets: 221 10*3/uL (ref 150–400)
RBC: 4.26 MIL/uL (ref 4.22–5.81)
RDW: 12.6 % (ref 11.5–15.5)
WBC: 7.6 10*3/uL (ref 4.0–10.5)

## 2018-01-05 NOTE — Progress Notes (Signed)
Pt. Arrived one hr. Late for appt., no other PAT appt. Avail before his surgery date. Met briefly with pt., labs drawn today, PCR obtained. Pt. Given info.regarding PCR tx. If needed & also given pre surg. Scrub instruction for dos.  Pt. Surgeon conflicting Ditty vs. Elsner; will check on later.  Pt. Reports that he was told to hold Plavix 10 days before surgery & also to hold Celebrex for 5 days. Pt. Informed that he will be called by a nurse for his complete med. /surg. Hx. Later next week. Chart will be referred  Anesth. review due to his extensive cardiac hx.

## 2018-01-11 ENCOUNTER — Encounter (HOSPITAL_COMMUNITY): Admission: RE | Payer: Self-pay | Source: Ambulatory Visit

## 2018-01-11 ENCOUNTER — Ambulatory Visit (HOSPITAL_COMMUNITY): Admission: RE | Admit: 2018-01-11 | Payer: Medicare Other | Source: Ambulatory Visit | Admitting: Neurological Surgery

## 2018-01-11 SURGERY — LUMBAR LAMINECTOMY/DECOMPRESSION MICRODISCECTOMY 1 LEVEL
Anesthesia: General | Laterality: Right

## 2018-01-12 DIAGNOSIS — Z6829 Body mass index (BMI) 29.0-29.9, adult: Secondary | ICD-10-CM | POA: Diagnosis not present

## 2018-01-12 DIAGNOSIS — M4317 Spondylolisthesis, lumbosacral region: Secondary | ICD-10-CM | POA: Diagnosis not present

## 2018-01-12 DIAGNOSIS — M79651 Pain in right thigh: Secondary | ICD-10-CM | POA: Diagnosis not present

## 2018-01-12 DIAGNOSIS — I10 Essential (primary) hypertension: Secondary | ICD-10-CM | POA: Diagnosis not present

## 2018-01-17 DIAGNOSIS — M87051 Idiopathic aseptic necrosis of right femur: Secondary | ICD-10-CM | POA: Diagnosis not present

## 2018-01-17 DIAGNOSIS — M79651 Pain in right thigh: Secondary | ICD-10-CM | POA: Diagnosis not present

## 2018-01-25 DIAGNOSIS — M8700 Idiopathic aseptic necrosis of unspecified bone: Secondary | ICD-10-CM | POA: Diagnosis not present

## 2018-01-25 DIAGNOSIS — Z6829 Body mass index (BMI) 29.0-29.9, adult: Secondary | ICD-10-CM | POA: Diagnosis not present

## 2018-02-01 DIAGNOSIS — M87851 Other osteonecrosis, right femur: Secondary | ICD-10-CM | POA: Diagnosis not present

## 2018-02-01 DIAGNOSIS — Z6829 Body mass index (BMI) 29.0-29.9, adult: Secondary | ICD-10-CM | POA: Diagnosis not present

## 2018-02-01 DIAGNOSIS — M179 Osteoarthritis of knee, unspecified: Secondary | ICD-10-CM | POA: Diagnosis not present

## 2018-02-01 DIAGNOSIS — M5431 Sciatica, right side: Secondary | ICD-10-CM | POA: Diagnosis not present

## 2018-03-16 ENCOUNTER — Other Ambulatory Visit: Payer: Self-pay | Admitting: Cardiovascular Disease

## 2018-03-16 DIAGNOSIS — Z7902 Long term (current) use of antithrombotics/antiplatelets: Principal | ICD-10-CM

## 2018-03-16 DIAGNOSIS — Z5181 Encounter for therapeutic drug level monitoring: Secondary | ICD-10-CM

## 2018-03-21 DIAGNOSIS — M25551 Pain in right hip: Secondary | ICD-10-CM | POA: Diagnosis not present

## 2018-03-21 DIAGNOSIS — M87051 Idiopathic aseptic necrosis of right femur: Secondary | ICD-10-CM | POA: Diagnosis not present

## 2018-03-21 DIAGNOSIS — M1611 Unilateral primary osteoarthritis, right hip: Secondary | ICD-10-CM | POA: Diagnosis not present

## 2018-04-10 DIAGNOSIS — Z79899 Other long term (current) drug therapy: Secondary | ICD-10-CM | POA: Diagnosis not present

## 2018-04-10 DIAGNOSIS — E782 Mixed hyperlipidemia: Secondary | ICD-10-CM | POA: Diagnosis not present

## 2018-04-10 DIAGNOSIS — I2510 Atherosclerotic heart disease of native coronary artery without angina pectoris: Secondary | ICD-10-CM | POA: Diagnosis not present

## 2018-04-10 DIAGNOSIS — R739 Hyperglycemia, unspecified: Secondary | ICD-10-CM | POA: Diagnosis not present

## 2018-04-12 ENCOUNTER — Telehealth: Payer: Self-pay | Admitting: *Deleted

## 2018-04-12 DIAGNOSIS — Z6829 Body mass index (BMI) 29.0-29.9, adult: Secondary | ICD-10-CM | POA: Diagnosis not present

## 2018-04-12 DIAGNOSIS — R7303 Prediabetes: Secondary | ICD-10-CM | POA: Diagnosis not present

## 2018-04-12 DIAGNOSIS — E782 Mixed hyperlipidemia: Secondary | ICD-10-CM | POA: Diagnosis not present

## 2018-04-12 DIAGNOSIS — I2510 Atherosclerotic heart disease of native coronary artery without angina pectoris: Secondary | ICD-10-CM | POA: Diagnosis not present

## 2018-04-12 NOTE — Telephone Encounter (Signed)
Spoke with pt re: surgical clearance and the requirement of him being seen before he can be cleared. Pt is scheduled to see Dr. Allyson Sabal 05/15/18. Pt thanked me for the call.

## 2018-04-12 NOTE — Telephone Encounter (Signed)
   Primary Cardiologist:Jonathan Allyson Sabal, MD (last seen 09/27/17)  Chart reviewed as part of pre-operative protocol coverage. Because of Math Brazie Hamberger's past medical history and time since last visit, he/she will require a follow-up visit in order to better assess preoperative cardiovascular risk.  Pre-op covering staff: - Please schedule appointment and call patient to inform them. - Please contact requesting surgeon's office via preferred method (i.e, phone, fax) to inform them of need for appointment prior to surgery.  [place], [place]  04/12/2018, 3:01 PM

## 2018-04-12 NOTE — Telephone Encounter (Signed)
   [facility] Pre-operative Risk Assessment    Request for surgical clearance:  1. What type of surgery is being performed? RIGHT THA   2. When is this surgery scheduled? 06/05/18   3. What type of clearance is required (medical clearance vs. Pharmacy clearance to hold med vs. Both)? BOTH  4. Are there any medications that need to be held prior to surgery and how long?PLAVIX- THEY NEED INSTRUCTIONS   5. Practice name and name of physician performing surgery? [facility]   6. What is your office phone number [place]    7.   What is your office fax number [phone]  8.   Anesthesia type (None, local, MAC, general) ? SPINAL   Roger [name] 04/12/2018, 8:22 AM  _________________________________________________________________   (provider comments below)

## 2018-05-15 ENCOUNTER — Ambulatory Visit (INDEPENDENT_AMBULATORY_CARE_PROVIDER_SITE_OTHER): Payer: Medicare Other | Admitting: Cardiovascular Disease

## 2018-05-15 ENCOUNTER — Encounter: Payer: Self-pay | Admitting: Cardiovascular Disease

## 2018-05-15 VITALS — BP 118/68 | HR 85 | Ht 70.0 in | Wt 209.0 lb

## 2018-05-15 DIAGNOSIS — Z01818 Encounter for other preprocedural examination: Secondary | ICD-10-CM | POA: Diagnosis not present

## 2018-05-15 DIAGNOSIS — E785 Hyperlipidemia, unspecified: Secondary | ICD-10-CM

## 2018-05-15 DIAGNOSIS — Z720 Tobacco use: Secondary | ICD-10-CM

## 2018-05-15 NOTE — Assessment & Plan Note (Signed)
She has CAD status post anterior STEMI 11/04/2015 treated with direct intervention using a Promus drug-eluting stent.  He did have noncritical disease otherwise.  He had ventricular fibrillation twice during the case requiring defibrillation but not CPR.  Because of recurrent symptoms he underwent stress testing in April 2017 revealing ischemia in the LAD territory and underwent recatheterization by Dr. Tresa EndoKelly 03/02/2016 revealing aggressive "in-stent restenosis within the LAD stent.  He underwent angina sculpt Cutting Balloon atherectomy successfully.  Subsequent EF has improved from the 40 to 45% range up to normal by 2D echo performed 05/30/2016.  He is asymptomatic.

## 2018-05-15 NOTE — Progress Notes (Signed)
05/15/2018 Roger [name] Drier   1951-06-25  [id number]  Primary Physician Lewis Moccasinewey, Elizabeth R, MD Primary Cardiologist: Runell GessJonathan J Timmie Calix MD Milagros LollFACP, FACC, [place], [place]  HPI:  Roger [name] Roger is a 67 y.o. who I last saw in the office 09/27/2017.  He has a history of anterior STEMI on 12/21/16treatedWith direct intervention using a Promus drug eluting stent. He had noncritical disease otherwise. He did have ventricular fibrillation twice during the case requiring defibrillation but no CPR was performed. He was discharged home 4 days later. 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 A 2-D echo performed 11/11/15 revealed ejection fraction of 40-45% with anteroapical wall motion abnormality and mild to moderate posterior pericardial effusion. He saw Dr. Herbie BaltimoreHarding who began oral steroids. His symptoms resolved. He was a smoker prior to this and hasn't stopped smoking. I last saw him in the office 12/01/15. Because of recurrent symptoms he underwent stress testing in late April revealing ischemia in the LAD territory and ultimately he underwent recatheterization by Dr. Tresa EndoKelly 03/02/16 revealing aggressive in-stent restenosis within the LAD stent. He underwent angiosculpt cutting balloon atherectomy successfully. His EF at that time was 40-45% with mild anterolateral hypokinesia which has subsequently improved by 2-D echo performed7/17/17. Since I saw him a 8 months ago he's remained stable. He denies chest pain or shortness of breath. He apparently has avascular necrosis of his right femoral head and needs a right total hip replacement scheduled to be performed by Dr. Charlann Boxerlin on 06/05/2018.     Current Meds  Medication Sig  . acetaminophen (TYLENOL) 500 MG tablet Take 1,000 mg by mouth every 6 (six) hours as needed for moderate pain or headache.  Marland Kitchen. aspirin EC 81 MG EC tablet Take 1 tablet (81 mg total) by mouth daily.  Marland Kitchen. atorvastatin  (LIPITOR) 20 MG tablet Take 1 tablet (20 mg total) by mouth every evening.  . Cholecalciferol (VITAMIN D3) 1000 UNITS CAPS Take 1,000 Units by mouth 2 (two) times daily.   . clopidogrel (PLAVIX) 75 MG tablet TAKE 1 TABLET BY MOUTH DAILY  . ezetimibe (ZETIA) 10 MG tablet Take 1 tablet (10 mg total) by mouth daily.  . metoprolol tartrate (LOPRESSOR) 25 MG tablet Take 1 tablet (25 mg total) by mouth 2 (two) times daily.  Marland Kitchen. NITROSTAT 0.4 MG SL tablet PLACE 1 TABLET (0.4 MG TOTAL) UNDER THE TONGUE EVERY 5 (FIVE) MINUTESAS NEEDED FOR CHEST PAIN (UP TO 3 DOSES).     No Known Allergies  Social History   Socioeconomic History  . Marital status: Married    Spouse name: Not on file  . Number of children: Not on file  . Years of education: Not on file  . Highest education level: Not on file  Occupational History  . Not on file  Social Needs  . Financial resource strain: Not on file  . Food insecurity:    Worry: Not on file    Inability: Not on file  . Transportation needs:    Medical: Not on file    Non-medical: Not on file  Tobacco Use  . Smoking status: Former Smoker    Packs/day: 1.00    Years: 50.00    Pack years: 50.00    Types: Cigarettes    Last attempt to quit: 11/04/2015    Years since quitting: 2.5  . Smokeless tobacco: Never Used  Substance and Sexual Activity  . Alcohol use: Yes    Alcohol/week:  1.8 oz    Types: 1 Glasses of wine, 1 Cans of beer, 1 Shots of liquor per week  . Drug use: No  . Sexual activity: Not Currently    Comment: married  Lifestyle  . Physical activity:    Days per week: Not on file    Minutes per session: Not on file  . Stress: Not on file  Relationships  . Social connections:    Talks on phone: Not on file    Gets together: Not on file    Attends religious service: Not on file    Active member of club or organization: Not on file    Attends meetings of clubs or organizations: Not on file    Relationship status: Not on file  . Intimate  partner violence:    Fear of current or ex partner: Not on file    Emotionally abused: Not on file    Physically abused: Not on file    Forced sexual activity: Not on file  Other Topics Concern  . Not on file  Social History Narrative  . Not on file     Review of Systems: General: negative for chills, fever, night sweats or weight changes.  Cardiovascular: negative for chest pain, dyspnea on exertion, edema, orthopnea, palpitations, paroxysmal nocturnal dyspnea or shortness of breath Dermatological: negative for rash Respiratory: negative for cough or wheezing Urologic: negative for hematuria Abdominal: negative for nausea, vomiting, diarrhea, bright red blood per rectum, melena, or hematemesis Neurologic: negative for visual changes, syncope, or dizziness All other systems reviewed and are otherwise negative except as noted above.    Blood pressure 118/68, pulse 85, height 5\' 10"  (1.778 [name]), weight 209 lb (94.8 kg).  General appearance: alert and no distress Neck: no adenopathy, no carotid bruit, no JVD, supple, symmetrical, trachea midline and thyroid not enlarged, symmetric, no tenderness/mass/nodules Lungs: clear to auscultation bilaterally Heart: regular rate and rhythm, S1, S2 normal, no murmur, click, rub or gallop Extremities: extremities normal, atraumatic, no cyanosis or edema Pulses: 2+ and symmetric Skin: Skin color, texture, turgor normal. No rashes or lesions Neurologic: Alert and oriented X 3, normal strength and tone. Normal symmetric reflexes. Normal coordination and gait  EKG sinus rhythm at 85 without ST or T wave changes.  There was low limb voltage.  I personally reviewed this EKG.  ASSESSMENT AND PLAN:   CAD S/P percutaneous coronary angioplasty She has CAD status post anterior STEMI 11/04/2015 treated with direct intervention using a Promus drug-eluting stent.  He did have noncritical disease otherwise.  He had ventricular fibrillation twice during the case  requiring defibrillation but not CPR.  Because of recurrent symptoms he underwent stress testing in April 2017 revealing ischemia in the LAD territory and underwent recatheterization by Dr. Tresa Endo 03/02/2016 revealing aggressive "in-stent restenosis within the LAD stent.  He underwent angina sculpt Cutting Balloon atherectomy successfully.  Subsequent EF has improved from the 40 to 45% range up to normal by 2D echo performed 05/30/2016.  He is asymptomatic.  Dyslipidemia History of hyperlipidemia on statin therapy followed by his PCP  Tobacco abuse History of discontinued tobacco abuse      Runell Gess MD [facility], [facility] 05/15/2018 8:37 AM

## 2018-05-15 NOTE — Patient Instructions (Signed)
Medication Instructions: Your physician recommends that you continue on your current medications as directed. Please refer to the Current Medication list given to you today.  Testing/Procedures: Your physician has requested that you have a lexiscan myoview. For further information please visit [email or web address] Please follow instruction sheet, as given.  Follow-Up: Your physician wants you to follow-up in: 1 year if needed with Dr. Allyson SabalBerry. You will receive a reminder letter in the mail two months in advance. If you don't receive a letter, please call our office to schedule the follow-up appointment.  If you need a refill on your cardiac medications before your next appointment, please call your pharmacy.

## 2018-05-15 NOTE — Assessment & Plan Note (Signed)
History of hyperlipidemia on statin therapy followed by his PCP 

## 2018-05-15 NOTE — Assessment & Plan Note (Signed)
History of discontinued tobacco abuse 

## 2018-05-16 ENCOUNTER — Telehealth (HOSPITAL_COMMUNITY): Payer: Self-pay

## 2018-05-16 NOTE — Telephone Encounter (Signed)
Encounter complete. 

## 2018-05-22 ENCOUNTER — Ambulatory Visit (HOSPITAL_COMMUNITY)
Admission: RE | Admit: 2018-05-22 | Discharge: 2018-05-22 | Disposition: A | Discharge status: Home / Self Care | Payer: Medicare Other | Source: Ambulatory Visit | Attending: Cardiovascular Disease | Admitting: Cardiovascular Disease

## 2018-05-22 DIAGNOSIS — Z01818 Encounter for other preprocedural examination: Secondary | ICD-10-CM | POA: Diagnosis not present

## 2018-05-22 DIAGNOSIS — Z01810 Encounter for preprocedural cardiovascular examination: Secondary | ICD-10-CM | POA: Insufficient documentation

## 2018-05-22 LAB — MYOCARDIAL PERFUSION IMAGING
CHL CUP NUCLEAR SRS: 8
CHL CUP RESTING HR STRESS: 70 {beats}/min
LV dias vol: 93 mL (ref 62–150)
LV sys vol: 43 mL
Peak HR: 96 {beats}/min
SDS: 1
SSS: 9
TID: 1.13

## 2018-05-22 MED ORDER — REGADENOSON 0.4 MG/5ML IV SOLN
0.4000 mg | Freq: Once | INTRAVENOUS | Status: AC
  Administered 2018-05-22: 0.4 mg via INTRAVENOUS

## 2018-05-22 MED ORDER — TECHNETIUM TC 99M TETROFOSMIN IV KIT
10.1000 | PACK | Freq: Once | INTRAVENOUS | Status: AC | PRN
  Administered 2018-05-22: 10.1 via INTRAVENOUS
  Filled 2018-05-22: qty 11

## 2018-05-22 MED ORDER — TECHNETIUM TC 99M TETROFOSMIN IV KIT
31.8000 | PACK | Freq: Once | INTRAVENOUS | Status: AC | PRN
  Administered 2018-05-22: 31.8 via INTRAVENOUS
  Filled 2018-05-22: qty 32

## 2018-05-27 NOTE — H&P (Signed)
TOTAL HIP ADMISSION H&P  Patient is admitted for right total hip arthroplasty, anterior approach.  Subjective:  Chief Complaint:   Right hip primary OA / pain  HPI: Roger [name], 67 y.o. male, has a history of pain and functional disability in the right hip(s) due to arthritis and patient has failed non-surgical conservative treatments for greater than 12 weeks to include NSAID's and/or analgesics and activity modification.  Onset of symptoms was gradual starting ~1 year ago with rapidlly worsening course since that time.The patient noted no past surgery on the right hip(s).  Patient currently rates pain in the right hip at 8 out of 10 with activity. Patient has worsening of pain with activity and weight bearing, trendelenberg gait, pain that interfers with activities of daily living and pain with passive range of motion. Patient has evidence of periarticular osteophytes and joint space narrowing by imaging studies. This condition presents safety issues increasing the risk of falls.  There is no current active infection.  Risks, benefits and expectations were discussed with the patient.  Risks including but not limited to the risk of anesthesia, blood clots, nerve damage, blood vessel damage, failure of the prosthesis, infection and up to and including death.  Patient understand the risks, benefits and expectations and wishes to proceed with surgery.   PCP: Lewis Moccasinewey, Elizabeth R, MD  D/C Plans:       Home   Post-op Meds:       No Rx given   Tranexamic Acid:      To be given - IV   Decadron:      Is to be given  FYI:     Plavix & ASA  Norco  DME: DME arranged   PT:  No PT    Patient Active Problem List   Diagnosis Date Noted  . Syncope 04/21/2016  . Syncope and collapse 04/21/2016  . Unstable angina (HCC)   . Abnormal nuclear stress test   . Chest pain 02/16/2016  . CAD S/P percutaneous coronary angioplasty 11/08/2015  . Cardiomyopathy, ischemic 11/08/2015  . Chronic combined  systolic and diastolic heart failure, NYHA class 1 (HCC) 11/08/2015  . Ventricular fibrillation (HCC) 11/08/2015  . Hyperglycemia 11/08/2015  . Leukocytosis 11/08/2015  . Dressler's syndrome (HCC) 11/08/2015  . Shortness of breath   . History of ST elevation myocardial infarction (STEMI) 11/04/2015  . Sustained ventricular tachycardia (HCC) - during anterior MI 11/04/2015  . Kidney calculus   . Dyslipidemia   . Colon polyp   . Tobacco abuse    Past Medical History:  Diagnosis Date  . Anginal pain (HCC)    "since 11/04/2015" (03/02/2016)  . Arthritis    "back" (03/02/2016)  . CAD (coronary artery disease)    a. Ant STEMI 10/2015 - s/p DES to LAD, residual RCA disease being treated medically.   Marland Kitchen. CAD S/P percutaneous coronary angioplasty 11/04/2015   a. Ant STEMI 10/2015 - s/p DES to LAD , (Promus Premier DES 2.75 mm x 16 mm; 3.3 mm), residual ~50+% ulcerated RCA disease being treated medically.   . Chronic combined systolic and diastolic CHF (congestive heart failure) (HCC)    a. 2D echo 10/2015 at time of MI: EF 45-50%, grade 1 DD.  Marland Kitchen. Chronic lower back pain   . Colon polyp   . 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 Headache    "sometimes daily; sometimes less often; no much now" (03/02/2016)  . Hyperglycemia    a. 10/2015 -  A1C 6.5.  . Hyperlipidemia with target LDL less than 70   . Ischemic cardiomyopathy    a. 12/22 Echo: Post anterior STEMI EF 45-50%, followup echo 11/11/15 EF 40-45%.  . Kidney calculus    "passed"  . Pneumonia 2016  . S/P angioplasty with stent 03/02/16 mLAD angioplasty  03/03/2016  . ST elevation (STEMI) myocardial infarction involving left anterior descending coronary artery (HCC) 11/04/2015   LAD 100%,   . Sustained VT (ventricular tachycardia) (HCC)    a. Ischemic -- Had VT-VF during Cath for Anterior STEMI - Defib x 2  . Syncope and collapse 04/2016  . Tobacco abuse     Past Surgical History:  Procedure Laterality Date   . CARDIAC CATHETERIZATION N/A 11/04/2015   Procedure: Left Heart Cath and Coronary Angiography;  Surgeon: Runell Gess, MD;  Location[facility] INVASIVE CV LAB;  Service: Cardiovascular;  mLAD 50% then 100%, mRCA 50% ulcerted -- PCI LAD  . CARDIAC CATHETERIZATION N/A 11/04/2015   Procedure: Coronary Stent Intervention;  Surgeon: Runell Gess, MD;  Location: [facility] INVASIVE CV LAB;  Service: Cardiovascular;  DES PCI to mLAD - Promus Premier 2.75 mm x 16 mm (3.3.mm)  . CARDIAC CATHETERIZATION N/A 03/02/2016   Procedure: Left Heart Cath and Coronary Angiography;  Surgeon: Lennette Bihari, MD;  Location: [facility] INVASIVE CV LAB;  Service: Cardiovascular;  Laterality: N/A;  . CARDIAC CATHETERIZATION N/A 03/02/2016   Procedure: Coronary Balloon Angioplasty;  Surgeon: Lennette Bihari, MD;  Location: [facility] INVASIVE CV LAB;  Service: Cardiovascular;  Laterality: N/A;  . CORONARY ANGIOPLASTY  03/02/2016  . INGUINAL HERNIA REPAIR Left   . TRANSTHORACIC ECHOCARDIOGRAM  12/22 & 12/28 2016   a. Mild LVH, EF 45-50. Mid-apical-anteroseptal hypokinesis, grade 1 DD with high LVEDP and trace MR.; b. EF 40-45%, mid apical anteroseptal, anterior, apical akinesis. Moderate MR, small-moderate posterior pericardial effusion.  Marland Kitchen VASECTOMY      No current facility-administered medications for this encounter.    Current Outpatient Medications  Medication Sig Dispense Refill Last Dose  . acetaminophen (TYLENOL) 500 MG tablet Take 1,000 mg by mouth every 6 (six) hours as needed for moderate pain or headache.   Taking  . aspirin EC 81 MG EC tablet Take 1 tablet (81 mg total) by mouth daily. (Patient taking differently: Take 81 mg by mouth every morning. ) 30 tablet 11 Taking  . atorvastatin (LIPITOR) 20 MG tablet Take 1 tablet (20 mg total) by mouth every evening. 90 tablet 3 Taking  . Cholecalciferol (VITAMIN D3) 1000 UNITS CAPS Take 1,000 Units by mouth 2 (two) times daily.    Taking  . clopidogrel (PLAVIX) 75 MG tablet TAKE 1 TABLET  BY MOUTH DAILY (Patient taking differently: 1 tablet by mouth daily in the morning.) 90 tablet 0 Taking  . ezetimibe (ZETIA) 10 MG tablet Take 1 tablet (10 mg total) by mouth daily. (Patient taking differently: Take 10 mg by mouth every morning. ) 90 tablet 3 Taking  . metoprolol tartrate (LOPRESSOR) 25 MG tablet Take 1 tablet (25 mg total) by mouth 2 (two) times daily. 180 tablet 1 Taking  . NITROSTAT 0.4 MG SL tablet PLACE 1 TABLET (0.4 MG TOTAL) UNDER THE TONGUE EVERY 5 (FIVE) MINUTESAS NEEDED FOR CHEST PAIN (UP TO 3 DOSES). 25 tablet 2 Taking   No Known Allergies   Social History   Tobacco Use  . Smoking status: Former Smoker    Packs/day: 1.00    Years: 50.00    Pack years: 50.00  Types: Cigarettes    Last attempt to quit: 11/04/2015    Years since quitting: 2.5  . Smokeless tobacco: Never Used  Substance Use Topics  . Alcohol use: Yes    Alcohol/week: 1.8 oz    Types: 1 Glasses of wine, 1 Cans of beer, 1 Shots of liquor per week    Family History  Problem Relation Age of Onset  . Aneurysm Mother   . Dementia Father   . Dystonia Sister      Review of Systems  Constitutional: Negative.   HENT: Negative.   Eyes: Negative.   Respiratory: Negative.   Cardiovascular: Negative.   Gastrointestinal: Negative.   Genitourinary: Positive for frequency.  Musculoskeletal: Positive for joint pain.  Skin: Negative.   Neurological: Negative.   Endo/Heme/Allergies: Negative.   Psychiatric/Behavioral: Negative.     Objective:  Physical Exam  Constitutional: He is oriented to person, place, and time. He appears well-developed.  HENT:  Head: Normocephalic.  Eyes: Pupils are equal, round, and reactive to light.  Neck: Neck supple. No JVD present. No tracheal deviation present. No thyromegaly present.  Cardiovascular: Normal rate, regular rhythm and intact distal pulses.  Respiratory: Effort normal and breath sounds normal. No respiratory distress. He has no wheezes.  GI:  Soft. There is no tenderness. There is no guarding.  Musculoskeletal:       Right hip: He exhibits decreased range of motion, decreased strength, tenderness and bony tenderness. He exhibits no swelling, no deformity and no laceration.  Lymphadenopathy:    He has no cervical adenopathy.  Neurological: He is alert and oriented to person, place, and time.  Skin: Skin is warm and dry.  Psychiatric: He has a normal mood and affect.      Labs:   Estimated body mass index is 29.99 kg/m as calculated from the following:   Height as of 05/22/18: 5\' 10"  (1.778 m).   Weight as of 05/22/18: 94.8 kg (209 lb).   Imaging Review Plain radiographs demonstrate severe degenerative joint disease of the right hip(s). The bone quality appears to be good for age and reported activity level.    Preoperative templating of the joint replacement has been completed, documented, and submitted to the Operating Room personnel in order to optimize intra-operative equipment management.     Assessment/Plan:  End stage arthritis, right hip(s)  The patient history, physical examination, clinical judgement of the provider and imaging studies are consistent with end stage degenerative joint disease of the right hip(s) and total hip arthroplasty is deemed medically necessary. The treatment options including medical management, injection therapy, arthroscopy and arthroplasty were discussed at length. The risks and benefits of total hip arthroplasty were presented and reviewed. The risks due to aseptic loosening, infection, stiffness, dislocation/subluxation,  thromboembolic complications and other imponderables were discussed.  The patient acknowledged the explanation, agreed to proceed with the plan and consent was signed. Patient is being admitted for inpatient treatment for surgery, pain control, PT, OT, prophylactic antibiotics, VTE prophylaxis, progressive ambulation and ADL's and discharge planning.The patient is  planning to be discharged home.    Anastasio Auerbach Star Cheese   PA-C  05/27/2018, 2:26 PM

## 2018-05-28 NOTE — Progress Notes (Signed)
CLEARANCE DR. Bea LauraE DEWEY 04-12-18 ON CHART   LOV PRE-OP CLEARANCE DR. Shela CommonsJ BERRY 05-15-18 Epic  EKG 05-15-18 Epic   STRESS TEST 05-22-18 Epic   HGBA1C 04-10-18 Epic   ECHO 05-30-16 Epic

## 2018-05-28 NOTE — Patient Instructions (Signed)
Roger ParkJeffrey M [name]  05/28/2018   Your procedure is scheduled on: 06-05-18   [name]    Report to admitting at 5:30AM    Call this number if you have problems the morning of surgery [phone]     Remember: Do not eat food or drink liquids :After Midnight.     Take these medicines the morning of surgery with A SIP OF WATER: EZETIMIBE, METOPROLOL, TYLENOL IF NEEDED                                You may not have any metal on your body including hair pins and              piercings  Do not wear jewelry, make-up, lotions, powders or perfumes, deodorant              Men may shave face and neck.   Do not bring valuables to the hospital. [facility] [name] IS NOT             RESPONSIBLE   FOR VALUABLES.  Contacts, dentures or bridgework may not be worn into surgery.  Leave suitcase in the car. After surgery it may be brought to your room.                  Please read over the following fact sheets you were given: _____________________________________________________________________             [facility] - Preparing for Surgery Before surgery, you can play an important role.  Because skin is not sterile, your skin needs to be as free of germs as possible.  You can reduce the number of germs on your skin by washing with CHG (chlorahexidine gluconate) soap before surgery.  CHG is an antiseptic cleaner which kills germs and bonds with the skin to continue killing germs even after washing. Please DO NOT use if you have an allergy to CHG or antibacterial soaps.  If your skin becomes reddened/irritated stop using the CHG and inform your nurse when you arrive at Short Stay. Do not shave (including legs and underarms) for at least 48 hours prior to the first CHG shower.  You may shave your face/neck. Please follow these instructions carefully:  1.  Shower with CHG Soap the night before surgery and the  morning of Surgery.  2.  If you choose  to wash your hair, wash your hair first as usual with your  normal  shampoo.  3.  After you shampoo, rinse your hair and body thoroughly to remove the  shampoo.                           4.  Use CHG as you would any other liquid soap.  You can apply chg directly  to the skin and wash                       Gently with a scrungie or clean washcloth.  5.  Apply the CHG Soap to your body ONLY FROM THE NECK DOWN.   Do not use on face/ open                           Wound or  open sores. Avoid contact with eyes, ears mouth and genitals (private parts).                       Wash face,  Genitals (private parts) with your normal soap.             6.  Wash thoroughly, paying special attention to the area where your surgery  will be performed.  7.  Thoroughly rinse your body with warm water from the neck down.  8.  DO NOT shower/wash with your normal soap after using and rinsing off  the CHG Soap.                9.  Pat yourself dry with a clean towel.            10.  Wear clean pajamas.            11.  Place clean sheets on your bed the night of your first shower and do not  sleep with pets. Day of Surgery : Do not apply any lotions/deodorants the morning of surgery.  Please wear clean clothes to the hospital/surgery center.  FAILURE TO FOLLOW THESE INSTRUCTIONS MAY RESULT IN THE CANCELLATION OF YOUR SURGERY PATIENT SIGNATURE_________________________________  NURSE SIGNATURE__________________________________  ________________________________________________________________________   Roger [name]  An incentive spirometer is a tool that can help keep your lungs clear and active. This tool measures how well you are filling your lungs with each breath. Taking long deep breaths may help reverse or decrease the chance of developing breathing (pulmonary) problems (especially infection) following:  A long period of time when you are unable to move or be active. BEFORE THE PROCEDURE   If the  spirometer includes an indicator to show your best effort, your nurse or respiratory therapist will set it to a desired goal.  If possible, sit up straight or lean slightly forward. Try not to slouch.  Hold the incentive spirometer in an upright position. INSTRUCTIONS FOR USE  1. Sit on the edge of your bed if possible, or sit up as far as you can in bed or on a chair. 2. Hold the incentive spirometer in an upright position. 3. Breathe out normally. 4. Place the mouthpiece in your mouth and seal your lips tightly around it. 5. Breathe in slowly and as deeply as possible, raising the piston or the ball toward the top of the column. 6. Hold your breath for 3-5 seconds or for as long as possible. Allow the piston or ball to fall to the bottom of the column. 7. Remove the mouthpiece from your mouth and breathe out normally. 8. Rest for a few seconds and repeat Steps 1 through 7 at least 10 times every 1-2 hours when you are awake. Take your time and take a few normal breaths between deep breaths. 9. The spirometer may include an indicator to show your best effort. Use the indicator as a goal to work toward during each repetition. 10. After each set of 10 deep breaths, practice coughing to be sure your lungs are clear. If you have an incision (the cut made at the time of surgery), support your incision when coughing by placing a pillow or rolled up towels firmly against it. Once you are able to get out of bed, walk around indoors and cough well. You may stop using the incentive spirometer when instructed by your caregiver.  RISKS AND COMPLICATIONS  Take your time so you do not get dizzy or  light-headed.  If you are in pain, you may need to take or ask for pain medication before doing incentive spirometry. It is harder to take a deep breath if you are having pain. AFTER USE  Rest and breathe slowly and easily.  It can be helpful to keep track of a log of your progress. Your caregiver can provide  you with a simple table to help with this. If you are using the spirometer at home, follow these instructions: [place] IF:   You are having difficultly using the spirometer.  You have trouble using the spirometer as often as instructed.  Your pain medication is not giving enough relief while using the spirometer.  You develop fever of 100.5 F (38.1 C) or higher. SEEK IMMEDIATE MEDICAL CARE IF:   You cough up bloody sputum that had not been present before.  You develop fever of 102 F (38.9 C) or greater.  You develop worsening pain at or near the incision site. MAKE SURE YOU:   Understand these instructions.  Will watch your condition.  Will get help right away if you are not doing well or get worse. Document Released: 03/13/2007 Document Revised: 01/23/2012 Document Reviewed: 05/14/2007 ExitCare Patient Information 2014 ExitCare, [place].   ________________________________________________________________________  WHAT IS A BLOOD TRANSFUSION? Blood Transfusion Information  A transfusion is the replacement of blood or some of its parts. Blood is made up of multiple cells which provide different functions.  Red blood cells carry oxygen and are used for blood loss replacement.  White blood cells fight against infection.  Platelets control bleeding.  Plasma helps clot blood.  Other blood products are available for specialized needs, such as hemophilia or other clotting disorders. BEFORE THE TRANSFUSION  Who gives blood for transfusions?   Healthy volunteers who are fully evaluated to make sure their blood is safe. This is blood bank blood. Transfusion therapy is the safest it has ever been in the practice of medicine. Before blood is taken from a donor, a complete history is taken to make sure that person has no history of diseases nor engages in risky social behavior (examples are intravenous drug use or sexual activity with multiple partners). The donor's  travel history is screened to minimize risk of transmitting infections, such as malaria. The donated blood is tested for signs of infectious diseases, such as HIV and hepatitis. The blood is then tested to be sure it is compatible with you in order to minimize the chance of a transfusion reaction. If you or a relative donates blood, this is often done in anticipation of surgery and is not appropriate for emergency situations. It takes many days to process the donated blood. RISKS AND COMPLICATIONS Although transfusion therapy is very safe and saves many lives, the [name] dangers of transfusion include:   Getting an infectious disease.  Developing a transfusion reaction. This is an allergic reaction to something in the blood you were given. Every precaution is taken to prevent this. The decision to have a blood transfusion has been considered carefully by your caregiver before blood is given. Blood is not given unless the benefits outweigh the risks. AFTER THE TRANSFUSION  Right after receiving a blood transfusion, you will usually feel much better and more energetic. This is especially true if your red blood cells have gotten low (anemic). The transfusion raises the level of the red blood cells which carry oxygen, and this usually causes an energy increase.  The nurse administering the transfusion will monitor you  carefully for complications. HOME CARE INSTRUCTIONS  No special instructions are needed after a transfusion. You may find your energy is better. Speak with your caregiver about any limitations on activity for underlying diseases you may have. SEEK MEDICAL CARE IF:   Your condition is not improving after your transfusion.  You develop redness or irritation at the intravenous (IV) site. SEEK IMMEDIATE MEDICAL CARE IF:  Any of the following symptoms occur over the next 12 hours:  Shaking chills.  You have a temperature by mouth above 102 F (38.9 C), not controlled by  medicine.  Chest, back, or muscle pain.  People around you feel you are not acting correctly or are confused.  Shortness of breath or difficulty breathing.  Dizziness and fainting.  You get a rash or develop hives.  You have a decrease in urine output.  Your urine turns a dark color or changes to pink, red, or brown. Any of the following symptoms occur over the next 10 days:  You have a temperature by mouth above 102 F (38.9 C), not controlled by medicine.  Shortness of breath.  Weakness after normal activity.  The white part of the eye turns yellow (jaundice).  You have a decrease in the amount of urine or are urinating less often.  Your urine turns a dark color or changes to pink, red, or brown. Document Released: 10/28/2000 Document Revised: 01/23/2012 Document Reviewed: 06/16/2008 [facility] Patient Information 2014 [place], [place].  _______________________________________________________________________

## 2018-05-30 ENCOUNTER — Encounter (HOSPITAL_COMMUNITY)
Admission: RE | Admit: 2018-05-30 | Discharge: 2018-05-30 | Disposition: A | Discharge status: Home / Self Care | Payer: Medicare Other | Source: Ambulatory Visit | Attending: Orthopedic Surgery | Admitting: Orthopedic Surgery

## 2018-05-30 ENCOUNTER — Encounter (HOSPITAL_COMMUNITY): Payer: Self-pay

## 2018-05-30 ENCOUNTER — Other Ambulatory Visit: Payer: Self-pay

## 2018-05-30 DIAGNOSIS — Z01818 Encounter for other preprocedural examination: Secondary | ICD-10-CM | POA: Diagnosis not present

## 2018-05-30 DIAGNOSIS — M1611 Unilateral primary osteoarthritis, right hip: Secondary | ICD-10-CM | POA: Diagnosis not present

## 2018-05-30 LAB — SURGICAL PCR SCREEN
MRSA, PCR: NEGATIVE
Staphylococcus aureus: NEGATIVE

## 2018-05-30 LAB — BASIC METABOLIC PANEL
ANION GAP: 7 (ref 5–15)
BUN: 18 mg/dL (ref 8–23)
CALCIUM: 9 mg/dL (ref 8.9–10.3)
CHLORIDE: 108 mmol/L (ref 98–111)
CO2: 24 mmol/L (ref 22–32)
Creatinine, Ser: 1.14 mg/dL (ref 0.61–1.24)
GFR calc Af Amer: >60 mL/min (ref >60)
GFR calc non Af Amer: >60 mL/min (ref >60)
GLUCOSE: 116 mg/dL — AB (ref 70–99)
Potassium: 4.7 mmol/L (ref 3.5–5.1)
Sodium: 139 mmol/L (ref 135–145)

## 2018-05-30 LAB — CBC
HEMATOCRIT: 40.3 % (ref 39.0–52.0)
Hemoglobin: 13.7 g/dL (ref 13.0–17.0)
MCH: 31.9 pg (ref 26.0–34.0)
MCHC: 34 g/dL (ref 30.0–36.0)
MCV: 93.9 fL (ref 78.0–100.0)
PLATELETS: 236 10*3/uL (ref 150–400)
RBC: 4.29 MIL/uL (ref 4.22–5.81)
RDW: 12.3 % (ref 11.5–15.5)
WBC: 5.7 10*3/uL (ref 4.0–10.5)

## 2018-05-30 LAB — ABO/RH: ABO/RH(D): B POS

## 2018-06-02 ENCOUNTER — Other Ambulatory Visit: Payer: Self-pay | Admitting: Cardiovascular Disease

## 2018-06-02 DIAGNOSIS — Z5181 Encounter for therapeutic drug level monitoring: Secondary | ICD-10-CM

## 2018-06-02 DIAGNOSIS — Z7902 Long term (current) use of antithrombotics/antiplatelets: Principal | ICD-10-CM

## 2018-06-04 ENCOUNTER — Other Ambulatory Visit: Payer: Self-pay | Admitting: Orthopedic Surgery

## 2018-06-04 NOTE — Anesthesia Preprocedure Evaluation (Addendum)
Anesthesia Evaluation  Patient identified by MRN, date of birth, ID band Patient awake    Reviewed: Allergy & Precautions, NPO status , Patient's Chart, lab work & pertinent test results  Airway Mallampati: II  TM Distance: >3 FB Neck ROM: Full    Dental no notable dental hx. (+) Teeth Intact, Dental Advisory Given   Pulmonary former smoker,    Pulmonary exam normal breath sounds clear to auscultation       Cardiovascular hypertension, Pt. on home beta blockers and Pt. on medications + angina + CAD and + Past MI  Normal cardiovascular exam Rhythm:Regular Rate:Normal  05/22/18 Myoview  The left ventricular ejection fraction is mildly decreased (45-54%).  Nuclear stress EF: 53%.  There was no ST segment deviation noted during stress.  Defect 1: There is a medium defect of mild severity present in the mid anterior, apical anterior and apex location   Neuro/Psych  Headaches, negative psych ROS   GI/Hepatic negative GI ROS, Neg liver ROS,   Endo/Other    Renal/GU Renal disease     Musculoskeletal  (+) Arthritis ,   Abdominal   Peds  Hematology negative hematology ROS (+)   Anesthesia Other Findings   Reproductive/Obstetrics                            Lab Results  Component Value Date   CREATININE 1.14 05/30/2018   BUN 18 05/30/2018   NA 139 05/30/2018   K 4.7 05/30/2018   CL 108 05/30/2018   CO2 24 05/30/2018    Lab Results  Component Value Date   WBC 5.7 05/30/2018   HGB 13.7 05/30/2018   HCT 40.3 05/30/2018   MCV 93.9 05/30/2018   PLT 236 05/30/2018    Anesthesia Physical Anesthesia Plan  ASA: III  Anesthesia Plan: Spinal   Post-op Pain Management:    Induction:   PONV Risk Score and Plan: Treatment may vary due to age or medical condition  Airway Management Planned: Mask, Natural Airway and Nasal Cannula  Additional Equipment:   Intra-op Plan:    Post-operative Plan:   Informed Consent: I have reviewed the patients History and Physical, chart, labs and discussed the procedure including the risks, benefits and alternatives for the proposed anesthesia with the patient or authorized representative who has indicated his/her understanding and acceptance.     Plan Discussed with: CRNA  Anesthesia Plan Comments: (?last plavix)        Anesthesia Quick Evaluation

## 2018-06-04 NOTE — Care Plan (Signed)
Ortho Bundle R THA scheduled on 06-05-18 DCP:  Home with spouse.  2 story home with 0 ste. DME: Rx for RW and 3-in-1 provided at H&P appt PT:  HEP

## 2018-06-05 ENCOUNTER — Inpatient Hospital Stay (HOSPITAL_COMMUNITY): Payer: Medicare Other | Admitting: Anesthesiology

## 2018-06-05 ENCOUNTER — Encounter (HOSPITAL_COMMUNITY): Payer: Self-pay

## 2018-06-05 ENCOUNTER — Other Ambulatory Visit: Payer: Self-pay

## 2018-06-05 ENCOUNTER — Encounter (HOSPITAL_COMMUNITY)
Admission: RE | Disposition: A | Discharge status: Home / Self Care | Payer: Self-pay | Source: Other Acute Inpatient Hospital | Attending: Orthopedic Surgery

## 2018-06-05 ENCOUNTER — Inpatient Hospital Stay (HOSPITAL_COMMUNITY): Payer: Medicare Other

## 2018-06-05 ENCOUNTER — Inpatient Hospital Stay (HOSPITAL_COMMUNITY)
Admission: RE | Admit: 2018-06-05 | Discharge: 2018-06-06 | DRG: 470 | Disposition: A | Discharge status: Home / Self Care | Payer: Medicare Other | Source: Other Acute Inpatient Hospital | Attending: Orthopedic Surgery | Admitting: Orthopedic Surgery

## 2018-06-05 DIAGNOSIS — I252 Old myocardial infarction: Secondary | ICD-10-CM | POA: Diagnosis not present

## 2018-06-05 DIAGNOSIS — Z96649 Presence of unspecified artificial hip joint: Secondary | ICD-10-CM

## 2018-06-05 DIAGNOSIS — Z7902 Long term (current) use of antithrombotics/antiplatelets: Secondary | ICD-10-CM

## 2018-06-05 DIAGNOSIS — M545 Low back pain: Secondary | ICD-10-CM | POA: Diagnosis present

## 2018-06-05 DIAGNOSIS — Z955 Presence of coronary angioplasty implant and graft: Secondary | ICD-10-CM

## 2018-06-05 DIAGNOSIS — Z96641 Presence of right artificial hip joint: Secondary | ICD-10-CM

## 2018-06-05 DIAGNOSIS — E663 Overweight: Secondary | ICD-10-CM | POA: Diagnosis present

## 2018-06-05 DIAGNOSIS — I5042 Chronic combined systolic (congestive) and diastolic (congestive) heart failure: Secondary | ICD-10-CM | POA: Diagnosis present

## 2018-06-05 DIAGNOSIS — Z7982 Long term (current) use of aspirin: Secondary | ICD-10-CM

## 2018-06-05 DIAGNOSIS — E785 Hyperlipidemia, unspecified: Secondary | ICD-10-CM | POA: Diagnosis not present

## 2018-06-05 DIAGNOSIS — M87051 Idiopathic aseptic necrosis of right femur: Secondary | ICD-10-CM | POA: Diagnosis not present

## 2018-06-05 DIAGNOSIS — Z79899 Other long term (current) drug therapy: Secondary | ICD-10-CM

## 2018-06-05 DIAGNOSIS — M1611 Unilateral primary osteoarthritis, right hip: Secondary | ICD-10-CM | POA: Diagnosis present

## 2018-06-05 DIAGNOSIS — M8788 Other osteonecrosis, other site: Secondary | ICD-10-CM | POA: Diagnosis present

## 2018-06-05 DIAGNOSIS — M87851 Other osteonecrosis, right femur: Secondary | ICD-10-CM | POA: Diagnosis not present

## 2018-06-05 DIAGNOSIS — I110 Hypertensive heart disease with heart failure: Secondary | ICD-10-CM | POA: Diagnosis not present

## 2018-06-05 DIAGNOSIS — I2510 Atherosclerotic heart disease of native coronary artery without angina pectoris: Secondary | ICD-10-CM | POA: Diagnosis present

## 2018-06-05 DIAGNOSIS — G8929 Other chronic pain: Secondary | ICD-10-CM | POA: Diagnosis present

## 2018-06-05 HISTORY — PX: TOTAL HIP ARTHROPLASTY: SHX124

## 2018-06-05 LAB — TYPE AND SCREEN
ABO/RH(D): B POS
ANTIBODY SCREEN: NEGATIVE

## 2018-06-05 SURGERY — ARTHROPLASTY, HIP, TOTAL, ANTERIOR APPROACH
Anesthesia: Spinal | Site: Hip | Laterality: Right

## 2018-06-05 MED ORDER — ACETAMINOPHEN 325 MG PO TABS: 325.0000 mg | ORAL_TABLET | Freq: Four times a day (QID) | ORAL | Status: DC | PRN

## 2018-06-05 MED ORDER — ONDANSETRON HCL 4 MG/2ML IJ SOLN: 4.0000 mg | Freq: Once | INTRAMUSCULAR | Status: DC | PRN

## 2018-06-05 MED ORDER — METHOCARBAMOL 500 MG PO TABS: 500.0000 mg | ORAL_TABLET | Freq: Four times a day (QID) | ORAL | 0 refills | Status: AC | PRN

## 2018-06-05 MED ORDER — NITROGLYCERIN 0.4 MG SL SUBL: 0.4000 mg | SUBLINGUAL_TABLET | SUBLINGUAL | Status: DC | PRN

## 2018-06-05 MED ORDER — PHENYLEPHRINE 40 MCG/ML (10ML) SYRINGE FOR IV PUSH (FOR BLOOD PRESSURE SUPPORT)
PREFILLED_SYRINGE | INTRAVENOUS | Status: AC
  Filled 2018-06-05: qty 10

## 2018-06-05 MED ORDER — FENTANYL CITRATE (PF) 100 MCG/2ML IJ SOLN
INTRAMUSCULAR | Status: AC
  Filled 2018-06-05: qty 2

## 2018-06-05 MED ORDER — CEFAZOLIN SODIUM-DEXTROSE 2-4 GM/100ML-% IV SOLN
INTRAVENOUS | Status: AC
  Filled 2018-06-05: qty 100

## 2018-06-05 MED ORDER — ASPIRIN EC 81 MG PO TBEC
81.0000 mg | DELAYED_RELEASE_TABLET | Freq: Every morning | ORAL | Status: DC
  Administered 2018-06-06: 81 mg via ORAL
  Filled 2018-06-05: qty 1

## 2018-06-05 MED ORDER — PHENOL 1.4 % MT LIQD
1.0000 | OROMUCOSAL | Status: DC | PRN
  Filled 2018-06-05: qty 177

## 2018-06-05 MED ORDER — HYDROCODONE-ACETAMINOPHEN 5-325 MG PO TABS
1.0000 | ORAL_TABLET | ORAL | Status: DC | PRN
  Administered 2018-06-05: 2 via ORAL
  Filled 2018-06-05: qty 2

## 2018-06-05 MED ORDER — SODIUM CHLORIDE 0.9 % IV SOLN
INTRAVENOUS | Status: DC
  Administered 2018-06-05 (×2): via INTRAVENOUS

## 2018-06-05 MED ORDER — TRANEXAMIC ACID 1000 MG/10ML IV SOLN
INTRAVENOUS | Status: AC
  Filled 2018-06-05: qty 10

## 2018-06-05 MED ORDER — PROPOFOL 500 MG/50ML IV EMUL
INTRAVENOUS | Status: DC | PRN
  Administered 2018-06-05: 40 ug/kg/min via INTRAVENOUS

## 2018-06-05 MED ORDER — EPHEDRINE 5 MG/ML INJ
INTRAVENOUS | Status: AC
  Filled 2018-06-05: qty 10

## 2018-06-05 MED ORDER — METHOCARBAMOL 1000 MG/10ML IJ SOLN
500.0000 mg | Freq: Four times a day (QID) | INTRAVENOUS | Status: DC | PRN
  Administered 2018-06-05: 500 mg via INTRAVENOUS
  Filled 2018-06-05: qty 550

## 2018-06-05 MED ORDER — DIPHENHYDRAMINE HCL 12.5 MG/5ML PO ELIX: 12.5000 mg | ORAL_SOLUTION | ORAL | Status: DC | PRN

## 2018-06-05 MED ORDER — ONDANSETRON HCL 4 MG/2ML IJ SOLN
INTRAMUSCULAR | Status: DC | PRN
  Administered 2018-06-05: 4 mg via INTRAVENOUS

## 2018-06-05 MED ORDER — FERROUS SULFATE 325 (65 FE) MG PO TABS: 325.0000 mg | ORAL_TABLET | Freq: Three times a day (TID) | ORAL | 3 refills | Status: AC

## 2018-06-05 MED ORDER — EPHEDRINE SULFATE-NACL 50-0.9 MG/10ML-% IV SOSY
PREFILLED_SYRINGE | INTRAVENOUS | Status: DC | PRN
  Administered 2018-06-05: 5 mg via INTRAVENOUS
  Administered 2018-06-05: 10 mg via INTRAVENOUS
  Administered 2018-06-05: 5 mg via INTRAVENOUS

## 2018-06-05 MED ORDER — POLYETHYLENE GLYCOL 3350 17 G PO PACK
17.0000 g | PACK | Freq: Two times a day (BID) | ORAL | Status: DC
  Administered 2018-06-05 – 2018-06-06 (×2): 17 g via ORAL
  Filled 2018-06-05 (×2): qty 1

## 2018-06-05 MED ORDER — ATORVASTATIN CALCIUM 20 MG PO TABS
20.0000 mg | ORAL_TABLET | Freq: Every evening | ORAL | Status: DC
  Administered 2018-06-05: 20 mg via ORAL
  Filled 2018-06-05: qty 1

## 2018-06-05 MED ORDER — CELECOXIB 200 MG PO CAPS
200.0000 mg | ORAL_CAPSULE | Freq: Two times a day (BID) | ORAL | Status: DC
  Administered 2018-06-05 – 2018-06-06 (×2): 200 mg via ORAL
  Filled 2018-06-05 (×2): qty 1

## 2018-06-05 MED ORDER — CEFAZOLIN SODIUM-DEXTROSE 2-4 GM/100ML-% IV SOLN
2.0000 g | Freq: Four times a day (QID) | INTRAVENOUS | Status: AC
  Administered 2018-06-05 (×2): 2 g via INTRAVENOUS
  Filled 2018-06-05 (×2): qty 100

## 2018-06-05 MED ORDER — TRANEXAMIC ACID 1000 MG/10ML IV SOLN
1000.0000 mg | INTRAVENOUS | Status: AC
  Administered 2018-06-05: 1000 mg via INTRAVENOUS

## 2018-06-05 MED ORDER — FERROUS SULFATE 325 (65 FE) MG PO TABS
325.0000 mg | ORAL_TABLET | Freq: Three times a day (TID) | ORAL | Status: DC
  Administered 2018-06-05 – 2018-06-06 (×3): 325 mg via ORAL
  Filled 2018-06-05 (×3): qty 1

## 2018-06-05 MED ORDER — PROPOFOL 10 MG/ML IV BOLUS
INTRAVENOUS | Status: AC
  Filled 2018-06-05: qty 20

## 2018-06-05 MED ORDER — HYDROMORPHONE HCL 1 MG/ML IJ SOLN: 0.2500 mg | INTRAMUSCULAR | Status: DC | PRN

## 2018-06-05 MED ORDER — DEXAMETHASONE SODIUM PHOSPHATE 10 MG/ML IJ SOLN
10.0000 mg | Freq: Once | INTRAMUSCULAR | Status: AC
  Administered 2018-06-05: 10 mg via INTRAVENOUS

## 2018-06-05 MED ORDER — MEPERIDINE HCL 50 MG/ML IJ SOLN: 6.2500 mg | INTRAMUSCULAR | Status: DC | PRN

## 2018-06-05 MED ORDER — ACETAMINOPHEN 10 MG/ML IV SOLN: 1000.0000 mg | Freq: Once | INTRAVENOUS | Status: DC | PRN

## 2018-06-05 MED ORDER — PROPOFOL 10 MG/ML IV BOLUS
INTRAVENOUS | Status: AC
  Filled 2018-06-05: qty 40

## 2018-06-05 MED ORDER — PHENYLEPHRINE 40 MCG/ML (10ML) SYRINGE FOR IV PUSH (FOR BLOOD PRESSURE SUPPORT)
PREFILLED_SYRINGE | INTRAVENOUS | Status: DC | PRN
  Administered 2018-06-05: 40 ug via INTRAVENOUS
  Administered 2018-06-05 (×4): 80 ug via INTRAVENOUS

## 2018-06-05 MED ORDER — METOCLOPRAMIDE HCL 5 MG PO TABS: 5.0000 mg | ORAL_TABLET | Freq: Three times a day (TID) | ORAL | Status: DC | PRN

## 2018-06-05 MED ORDER — LACTATED RINGERS IV SOLN
INTRAVENOUS | Status: DC
  Administered 2018-06-05: 06:00:00 via INTRAVENOUS

## 2018-06-05 MED ORDER — POLYETHYLENE GLYCOL 3350 17 G PO PACK: 17.0000 g | PACK | Freq: Two times a day (BID) | ORAL | 0 refills | Status: AC

## 2018-06-05 MED ORDER — HYDROMORPHONE HCL 1 MG/ML IJ SOLN
0.5000 mg | INTRAMUSCULAR | Status: DC | PRN
  Administered 2018-06-05: 1 mg via INTRAVENOUS
  Administered 2018-06-05: 2 mg via INTRAVENOUS
  Administered 2018-06-06: 1 mg via INTRAVENOUS
  Filled 2018-06-05: qty 1
  Filled 2018-06-05: qty 2
  Filled 2018-06-05: qty 1

## 2018-06-05 MED ORDER — METOCLOPRAMIDE HCL 5 MG/ML IJ SOLN: 5.0000 mg | Freq: Three times a day (TID) | INTRAMUSCULAR | Status: DC | PRN

## 2018-06-05 MED ORDER — HYDROCODONE-ACETAMINOPHEN 7.5-325 MG PO TABS: 1.0000 | ORAL_TABLET | Freq: Once | ORAL | Status: DC | PRN

## 2018-06-05 MED ORDER — CEFAZOLIN SODIUM-DEXTROSE 2-4 GM/100ML-% IV SOLN
2.0000 g | INTRAVENOUS | Status: AC
  Administered 2018-06-05: 2 g via INTRAVENOUS

## 2018-06-05 MED ORDER — SODIUM CHLORIDE 0.9 % IR SOLN
Status: DC | PRN
  Administered 2018-06-05: 1000 mL

## 2018-06-05 MED ORDER — BUPIVACAINE IN DEXTROSE 0.75-8.25 % IT SOLN
INTRATHECAL | Status: DC | PRN
  Administered 2018-06-05: 2 mL via INTRATHECAL

## 2018-06-05 MED ORDER — ALUM & MAG HYDROXIDE-SIMETH 200-200-20 MG/5ML PO SUSP: 15.0000 mL | ORAL | Status: DC | PRN

## 2018-06-05 MED ORDER — CHLORHEXIDINE GLUCONATE 4 % EX LIQD: 60.0000 mL | Freq: Once | CUTANEOUS | Status: DC

## 2018-06-05 MED ORDER — MIDAZOLAM HCL 2 MG/2ML IJ SOLN
INTRAMUSCULAR | Status: AC
  Filled 2018-06-05: qty 2

## 2018-06-05 MED ORDER — ONDANSETRON HCL 4 MG/2ML IJ SOLN: 4.0000 mg | Freq: Four times a day (QID) | INTRAMUSCULAR | Status: DC | PRN

## 2018-06-05 MED ORDER — BISACODYL 10 MG RE SUPP: 10.0000 mg | Freq: Every day | RECTAL | Status: DC | PRN

## 2018-06-05 MED ORDER — HYDROCODONE-ACETAMINOPHEN 7.5-325 MG PO TABS: 1.0000 | ORAL_TABLET | ORAL | 0 refills | Status: AC | PRN

## 2018-06-05 MED ORDER — EZETIMIBE 10 MG PO TABS
10.0000 mg | ORAL_TABLET | Freq: Every day | ORAL | Status: DC
  Administered 2018-06-05: 10 mg via ORAL
  Filled 2018-06-05: qty 1

## 2018-06-05 MED ORDER — MORPHINE SULFATE (PF) 2 MG/ML IV SOLN
0.5000 mg | INTRAVENOUS | Status: DC | PRN
  Administered 2018-06-05: 0.5 mg via INTRAVENOUS
  Administered 2018-06-05: 1 mg via INTRAVENOUS
  Filled 2018-06-05 (×2): qty 1

## 2018-06-05 MED ORDER — SODIUM CHLORIDE 0.9 % IV SOLN
1000.0000 mg | Freq: Once | INTRAVENOUS | Status: AC
  Administered 2018-06-05: 1000 mg via INTRAVENOUS
  Filled 2018-06-05: qty 1100

## 2018-06-05 MED ORDER — METOPROLOL TARTRATE 25 MG PO TABS
25.0000 mg | ORAL_TABLET | Freq: Two times a day (BID) | ORAL | Status: DC
Start: 2018-06-05 — End: 2018-06-06
  Administered 2018-06-05 – 2018-06-06 (×2): 25 mg via ORAL
  Filled 2018-06-05 (×2): qty 1

## 2018-06-05 MED ORDER — MAGNESIUM CITRATE PO SOLN: 1.0000 | Freq: Once | ORAL | Status: DC | PRN

## 2018-06-05 MED ORDER — STERILE WATER FOR IRRIGATION IR SOLN
Status: DC | PRN
  Administered 2018-06-05: 2000 mL

## 2018-06-05 MED ORDER — DOCUSATE SODIUM 100 MG PO CAPS: 100.0000 mg | ORAL_CAPSULE | Freq: Two times a day (BID) | ORAL | 0 refills | Status: AC

## 2018-06-05 MED ORDER — HYDROCODONE-ACETAMINOPHEN 7.5-325 MG PO TABS
1.0000 | ORAL_TABLET | ORAL | Status: DC | PRN
  Administered 2018-06-05 – 2018-06-06 (×3): 2 via ORAL
  Filled 2018-06-05 (×3): qty 2

## 2018-06-05 MED ORDER — ONDANSETRON HCL 4 MG PO TABS: 4.0000 mg | ORAL_TABLET | Freq: Four times a day (QID) | ORAL | Status: DC | PRN

## 2018-06-05 MED ORDER — CLOPIDOGREL BISULFATE 75 MG PO TABS
75.0000 mg | ORAL_TABLET | Freq: Every day | ORAL | Status: DC
  Administered 2018-06-06: 75 mg via ORAL
  Filled 2018-06-05: qty 1

## 2018-06-05 MED ORDER — DOCUSATE SODIUM 100 MG PO CAPS
100.0000 mg | ORAL_CAPSULE | Freq: Two times a day (BID) | ORAL | Status: DC
  Administered 2018-06-05 – 2018-06-06 (×2): 100 mg via ORAL
  Filled 2018-06-05 (×2): qty 1

## 2018-06-05 MED ORDER — DEXAMETHASONE SODIUM PHOSPHATE 10 MG/ML IJ SOLN
10.0000 mg | Freq: Once | INTRAMUSCULAR | Status: AC
  Administered 2018-06-06: 10 mg via INTRAVENOUS
  Filled 2018-06-05: qty 1

## 2018-06-05 MED ORDER — METHOCARBAMOL 500 MG PO TABS
500.0000 mg | ORAL_TABLET | Freq: Four times a day (QID) | ORAL | Status: DC | PRN
  Administered 2018-06-05 – 2018-06-06 (×2): 500 mg via ORAL
  Filled 2018-06-05 (×2): qty 1

## 2018-06-05 MED ORDER — MENTHOL 3 MG MT LOZG: 1.0000 | LOZENGE | OROMUCOSAL | Status: DC | PRN

## 2018-06-05 MED ORDER — MIDAZOLAM HCL 5 MG/5ML IJ SOLN
INTRAMUSCULAR | Status: DC | PRN
  Administered 2018-06-05: 2 mg via INTRAVENOUS

## 2018-06-05 SURGICAL SUPPLY — 36 items
BAG DECANTER FOR FLEXI CONT (MISCELLANEOUS) IMPLANT
BAG ZIPLOCK 12X15 (MISCELLANEOUS) IMPLANT
BLADE SAG 18X100X1.27 (BLADE) ×3 IMPLANT
CAPT HIP TOTAL 2 ×3 IMPLANT
COVER PERINEAL POST (MISCELLANEOUS) ×3 IMPLANT
COVER SURGICAL LIGHT HANDLE (MISCELLANEOUS) ×3 IMPLANT
DERMABOND ADVANCED (GAUZE/BANDAGES/DRESSINGS) ×2
DERMABOND ADVANCED .7 DNX12 (GAUZE/BANDAGES/DRESSINGS) ×1 IMPLANT
DRAPE STERI IOBAN 125X83 (DRAPES) ×3 IMPLANT
DRAPE U-SHAPE 47X51 STRL (DRAPES) ×6 IMPLANT
DRESSING AQUACEL AG SP 3.5X10 (GAUZE/BANDAGES/DRESSINGS) ×1 IMPLANT
DRSG AQUACEL AG SP 3.5X10 (GAUZE/BANDAGES/DRESSINGS) ×3
DURAPREP 26ML APPLICATOR (WOUND CARE) ×3 IMPLANT
ELECT REM PT RETURN 15FT ADLT (MISCELLANEOUS) ×3 IMPLANT
GLOVE BIO SURGEON STRL SZ7.5 (GLOVE) ×3 IMPLANT
GLOVE BIOGEL M STRL SZ7.5 (GLOVE) IMPLANT
GLOVE BIOGEL PI IND STRL 7.5 (GLOVE) ×4 IMPLANT
GLOVE BIOGEL PI IND STRL 8.5 (GLOVE) ×1 IMPLANT
GLOVE BIOGEL PI INDICATOR 7.5 (GLOVE) ×8
GLOVE BIOGEL PI INDICATOR 8.5 (GLOVE) ×2
GLOVE ECLIPSE 8.0 STRL XLNG CF (GLOVE) ×6 IMPLANT
GLOVE ORTHO TXT STRL SZ7.5 (GLOVE) ×3 IMPLANT
GOWN SPEC L4 XLG W/TWL (GOWN DISPOSABLE) ×6 IMPLANT
GOWN STRL REUS W/TWL 2XL LVL3 (GOWN DISPOSABLE) ×3 IMPLANT
GOWN STRL REUS W/TWL LRG LVL3 (GOWN DISPOSABLE) ×3 IMPLANT
HOLDER FOLEY CATH W/STRAP (MISCELLANEOUS) ×3 IMPLANT
PACK ANTERIOR HIP CUSTOM (KITS) ×3 IMPLANT
SUT MNCRL AB 4-0 PS2 18 (SUTURE) ×3 IMPLANT
SUT STRATAFIX 0 PDS 27 VIOLET (SUTURE) ×3
SUT VIC AB 1 CT1 36 (SUTURE) ×9 IMPLANT
SUT VIC AB 2-0 CT1 27 (SUTURE) ×4
SUT VIC AB 2-0 CT1 TAPERPNT 27 (SUTURE) ×2 IMPLANT
SUTURE STRATFX 0 PDS 27 VIOLET (SUTURE) ×1 IMPLANT
TRAY FOLEY MTR SLVR 16FR STAT (SET/KITS/TRAYS/PACK) ×3 IMPLANT
WATER STERILE IRR 1000ML POUR (IV SOLUTION) ×3 IMPLANT
YANKAUER SUCT BULB TIP 10FT TU (MISCELLANEOUS) IMPLANT

## 2018-06-05 NOTE — Anesthesia Postprocedure Evaluation (Signed)
Anesthesia Post Note  Patient: Roger [name]  Procedure(s) Performed: RIGHT TOTAL HIP ARTHROPLASTY ANTERIOR APPROACH (Right Hip)     Patient location during evaluation: PACU Anesthesia Type: Spinal Level of consciousness: oriented and awake and alert Pain management: pain level controlled Vital Signs Assessment: post-procedure vital signs reviewed and stable Respiratory status: spontaneous breathing, respiratory function stable and patient connected to nasal cannula oxygen Cardiovascular status: blood pressure returned to baseline and stable Postop Assessment: no headache, no backache and no apparent nausea or vomiting Anesthetic complications: no    Last Vitals:  Vitals:   06/05/18 1246 06/05/18 1357  BP: (!) 145/84 (!) 147/86  Pulse: 75 87  Resp: 14 14  Temp: 36.4 C 36.4 C  SpO2: 95% 98%    Last Pain:  Vitals:   06/05/18 1357  TempSrc: Oral  PainSc:                  Trevor IhaStephen A Murrell Elizondo

## 2018-06-05 NOTE — Evaluation (Signed)
Physical Therapy Evaluation Patient Details Name: Roger [name] MRN: [id number] DOB: 1951/04/23 Today's Date: 06/05/2018   History of Present Illness  R THA-DA  Clinical Impression  Pt is s/p THA resulting in the deficits listed below (see PT Problem List). Pt ambulated 9390' with RW and performed THA exercises with min assist. Good progress expected.  Pt will benefit from skilled PT to increase their independence and safety with mobility to allow discharge to the venue listed below.      Follow Up Recommendations Follow surgeon's recommendation for DC plan and follow-up therapies    Equipment Recommendations  None recommended by PT    Recommendations for Other Services       Precautions / Restrictions Precautions Precautions: Fall Precaution Comments: denies falls in past 1 year Restrictions RLE Weight Bearing: Weight bearing as tolerated      Mobility  Bed Mobility Overal bed mobility: Needs Assistance Bed Mobility: Supine to Sit     Supine to sit: Min assist;HOB elevated     General bed mobility comments: min A for RLE  Transfers Overall transfer level: Needs assistance Equipment used: Rolling walker (2 wheeled) Transfers: Sit to/from Stand Sit to Stand: From elevated surface;Min assist         General transfer comment: min A to rise, VCs hand placement  Ambulation/Gait Ambulation/Gait assistance: Min guard Gait Distance (Feet): 90 Feet Assistive device: Rolling walker (2 wheeled) Gait Pattern/deviations: Step-to pattern;Antalgic Gait velocity: decr   General Gait Details: VCs sequencing, no loss of balance  Stairs            Wheelchair Mobility    Modified Rankin (Stroke Patients Only)       Balance Overall balance assessment: Modified Independent                                           Pertinent Vitals/Pain Pain Assessment: 0-10 Pain Score: 8  Pain Location: R hip Pain Descriptors / Indicators: Aching Pain  Intervention(s): Limited activity within patient's tolerance;Monitored during session;Premedicated before session;Patient requesting pain meds-RN notified;Ice applied    Home Living Family/patient expects to be discharged to:: Private residence Living Arrangements: Spouse/significant other Available Help at Discharge: Family;Available 24 hours/day Type of Home: House Home Access: Stairs to enter   Enter[employer] CorporationEntrance Stairs-Number of Steps: 1 Home Layout: Two level;Able to live on main level with bedroom/bathroom Home Equipment: Walker - 4 wheels;Cane - single point;Bedside commode      Prior Function Level of Independence: Independent with assistive device(s)         Comments: walked with SPC     Hand Dominance        Extremity/Trunk Assessment   Upper Extremity Assessment Upper Extremity Assessment: Overall WFL for tasks assessed    Lower Extremity Assessment Lower Extremity Assessment: RLE deficits/detail RLE Deficits / Details: knee ext 3/5 RLE Sensation: WNL    Cervical / Trunk Assessment Cervical / Trunk Assessment: Normal  Communication   Communication: No difficulties  Cognition Arousal/Alertness: Awake/alert Behavior During Therapy: WFL for tasks assessed/performed Overall Cognitive Status: Within Functional Limits for tasks assessed                                        General Comments      Exercises Total Joint Exercises Ankle Circles/Pumps:  AROM;15 reps;Both;Supine Heel Slides: AAROM;Right;10 reps;Supine Hip ABduction/ADduction: AAROM;Right;10 reps;Supine Long Arc Quad: AROM;Right;5 reps;Seated   Assessment/Plan    PT Assessment Patient needs continued PT services  PT Problem List Decreased strength;Decreased activity tolerance;Decreased mobility;Pain       PT Treatment Interventions Gait training;DME instruction;Therapeutic activities;Functional mobility training;Stair training;Therapeutic exercise;Patient/family education    PT  Goals (Current goals can be found in the Care Plan section)  Acute Rehab PT Goals Patient Stated Goal: to play his guitar, go to beach condo ASAP PT Goal Formulation: With patient Time For Goal Achievement: 06/12/18 Potential to Achieve Goals: Good    Frequency 7X/week   Barriers to discharge        Co-evaluation               AM-PAC PT "6 Clicks" Daily Activity  Outcome Measure Difficulty turning over in bed (including adjusting bedclothes, sheets and blankets)?: A Little Difficulty moving from lying on back to sitting on the side of the bed? : Unable Difficulty sitting down on and standing up from a chair with arms (e.g., wheelchair, bedside commode, etc,.)?: Unable Help needed moving to and from a bed to chair (including a wheelchair)?: A Little Help needed walking in hospital room?: A Little Help needed climbing 3-5 steps with a railing? : A Lot 6 Click Score: 13    End of Session Equipment Utilized During Treatment: Gait belt Activity Tolerance: Patient limited by pain;Patient tolerated treatment well Patient left: in chair;with call bell/phone within reach Nurse Communication: Mobility status PT Visit Diagnosis: Difficulty in walking, not elsewhere classified (R26.2);Pain Pain - Right/Left: Right Pain - part of body: Hip    Time: 4098-1191 PT Time Calculation (min) (ACUTE ONLY): 43 min   Charges:   PT Evaluation $PT Eval Low Complexity: 1 Low PT Treatments $Gait Training: 8-22 mins $Therapeutic Exercise: 8-22 mins   PT G Codes:          Tamala Ser 06/05/2018, 4:13 PM [phone]

## 2018-06-05 NOTE — Interval H&P Note (Signed)
History and Physical Interval Note:  06/05/2018 6:55 AM  Roger [name]  has presented today for surgery, with the diagnosis of Right hip osteoarthritis  The various methods of treatment have been discussed with the patient and family. After consideration of risks, benefits and other options for treatment, the patient has consented to  Procedure(s) with comments: RIGHT TOTAL HIP ARTHROPLASTY ANTERIOR APPROACH (Right) - 70 mins as a surgical intervention .  The patient's history has been reviewed, patient examined, no change in status, stable for surgery.  I have reviewed the patient's chart and labs.  Questions were answered to the patient's satisfaction.     Shelda PalMatthew D Sugar Vanzandt

## 2018-06-05 NOTE — Anesthesia Procedure Notes (Signed)
Spinal  Start time: 06/05/2018 7:25 AM End time: 06/05/2018 7:30 AM Staffing Resident/CRNA: Victoriano Lain, CRNA Preanesthetic Checklist Completed: patient identified, site marked, surgical consent, pre-op evaluation, timeout performed, IV checked, risks and benefits discussed and monitors and equipment checked Spinal Block Patient position: sitting Prep: site prepped and draped and DuraPrep Patient monitoring: heart rate, continuous pulse ox and blood pressure Approach: midline Location: L3-4 Injection technique: single-shot Needle Needle type: Pencan  Needle gauge: 24 G Needle length: 10 cm Assessment Sensory level: T4 Additional Notes Spinal kit expiration date checked and verified. Pt placed in sitting position for spinal. One attempt. + CSF, - heme. Pt tolerated well.

## 2018-06-05 NOTE — Op Note (Signed)
NAME:  Roger [name]                ACCOUNT NO.: [id number]      MEDICAL RECORD NO.: [id number]      [facility]      PHYSICIAN:  Shelda PalMatthew D Jeanluc Wegman  DATE OF BIRTH:  06-Nov-1951     DATE OF PROCEDURE:  06/05/2018                                 OPERATIVE REPORT         PREOPERATIVE DIAGNOSIS: Right  hip avascular necrosis.      POSTOPERATIVE DIAGNOSIS:  Right hip avascular necrosis.      PROCEDURE:  Right total hip replacement through an anterior approach   utilizing DePuy THR system, component size 56mm pinnacle cup, a size 36+4 neutral   Altrex liner, a size 5 hi Tri Lock stem with a 36+1.5 delta ceramic   ball.      SURGEON:  Madlyn FrankelMatthew D. Charlann Boxerlin, M.D.      ASSISTANT:  Lanney GinsMatthew Babish, PA-C     ANESTHESIA:  Spinal.      SPECIMENS:  None.      COMPLICATIONS:  None.      BLOOD LOSS:  300 cc     DRAINS:  None.      INDICATION OF THE PROCEDURE:  Roger [name] is a 67 y.o. male who had   presented to office for evaluation of right hip pain.  Radiographs revealed femoral head collapse consistent with advanced stage avascular necrosis.  The patient had painful limited range of   motion significantly affecting their overall quality of life and function.  The patient was failing to    respond to conservative measures including medications and/or injections and activity modification and at this point was ready   to proceed with more definitive measures.  Consent was obtained for   benefit of pain relief.  Specific risks of infection, DVT, component   failure, dislocation, neurovascular injury, and need for revision surgery were reviewed in the office as well discussion of   the anterior versus posterior approach were reviewed.     PROCEDURE IN DETAIL:  The patient was brought to operative theater.   Once adequate anesthesia, preoperative antibiotics, 2 gm of Ancef, 1 gm of Tranexamic Acid, and 10 mg of Decadron were administered, the patient was  positioned supine on the [employer]anna table.  Once the patient was safely positioned with adequate padding of boney prominences we predraped out the hip, and used fluoroscopy to confirm orientation of the pelvis.      The right hip was then prepped and draped from proximal iliac crest to   mid thigh with a shower curtain technique.      Time-out was performed identifying the patient, planned procedure, and the appropriate extremity.     An incision was then made 2 cm lateral to the   anterior superior iliac spine extending over the orientation of the   tensor fascia lata muscle and sharp dissection was carried down to the   fascia of the muscle.      The fascia was then incised.  The muscle belly was identified and swept   laterally and retractor placed along the superior neck.  Following   cauterization of the circumflex vessels and removing some pericapsular   fat, a second cobra retractor was placed  on the inferior neck.  A T-capsulotomy was made along the line of the   superior neck to the trochanteric fossa, then extended proximally and   distally.  Tag sutures were placed and the retractors were then placed   intracapsular.  We then identified the trochanteric fossa and   orientation of my neck cut and then made a neck osteotomy with the femur on traction.  The femoral   head was removed without difficulty or complication.  Traction was let   off and retractors were placed posterior and anterior around the   acetabulum.      The labrum and foveal tissue were debrided.  I began reaming with a 46 mm   reamer and reamed up to 55 mm reamer with good bony bed preparation and a 56 mm  cup was chosen.  The final 56 mm Pinnacle cup was then impacted under fluoroscopy to confirm the depth of penetration and orientation with respect to   Abduction and forward flexion.  A screw was placed into the ilium followed by the hole eliminator.  The final   36+4 neutral Altrex liner was impacted with good  visualized rim fit.  The cup was positioned anatomically within the acetabular portion of the pelvis.      At this point, the femur was rolled to 100 degrees.  Further capsule was   released off the inferior aspect of the femoral neck.  I then   released the superior capsule proximally.  With the leg in a neutral position the hook was placed laterally   along the femur under the vastus lateralis origin and elevated manually and then held in position using the hook attachment on the bed.  The leg was then extended and adducted with the leg rolled to 100   degrees of external rotation.  Retractors were placed along the medial calcar and posteriorly over the greater trochanter.  Once the proximal femur was fully   exposed, I used a box osteotome to set orientation.  I then began   broaching with the starting chili pepper broach and passed this by hand and then broached up to 5.  With the 5 broach in place I chose a high offset neck and did several trial reductions.  The offset was appropriate, leg lengths   appeared to be equal best matched with the +1.5 head ball trial confirmed radiographically.   Given these findings, I went ahead and dislocated the hip, repositioned all   retractors and positioned the right hip in the extended and abducted position.  The final 5 Hi Tri Lock stem was   chosen and it was impacted down to the level of neck cut.  Based on this   and the trial reductions, a final 36+1.5 delta ceramic ball was chosen and   impacted onto a clean and dry trunnion, and the hip was reduced.  The   hip had been irrigated throughout the case again at this point.  I did   reapproximate the superior capsular leaflet to the anterior leaflet   using #1 Vicryl.  The fascia of the   tensor fascia lata muscle was then reapproximated using #1 Vicryl and #0 Stratafix sutures.  The   remaining wound was closed with 2-0 Vicryl and running 4-0 Monocryl.   The hip was cleaned, dried, and dressed  sterilely using Dermabond and   Aquacel dressing.  The patient was then brought   to recovery room in stable condition tolerating the procedure well.  Lanney Gins, PA-C was present for the entirety of the case involved from   preoperative positioning, perioperative retractor management, general   facilitation of the case, as well as primary wound closure as assistant.            Madlyn Frankel Charlann Boxer, M.D.        06/05/2018 8:38 AM

## 2018-06-05 NOTE — Progress Notes (Signed)
PT Cancellation Note  Patient Details Name: Roger [name] MRN: [id number] DOB: 1951/05/30   Cancelled Treatment:    Reason Eval/Treat Not Completed: Pain limiting ability to participate(per RN, pt's pain is not well controlled, will check back later. )   Tamala SerUhlenberg, Markos Theil Kistler 06/05/2018, 1:28 PM [phone]

## 2018-06-05 NOTE — Transfer of Care (Signed)
Immediate Anesthesia Transfer of Care Note  Patient: Roger [name]  Procedure(s) Performed: RIGHT TOTAL HIP ARTHROPLASTY ANTERIOR APPROACH (Right Hip)  Patient Location: PACU  Anesthesia Type:Spinal  Level of Consciousness: awake, alert , oriented and patient cooperative  Airway & Oxygen Therapy: Patient Spontanous Breathing and Patient connected to face mask oxygen  Post-op Assessment: Report given to RN and Post -op Vital signs reviewed and stable  Post vital signs: Reviewed and stable  Last Vitals:  Vitals Value Taken Time  BP    Temp    Pulse 75 06/05/2018  9:03 AM  Resp 16 06/05/2018  9:03 AM  SpO2 99 % 06/05/2018  9:03 AM  Vitals shown include unvalidated device data.  Last Pain:  Vitals:   06/05/18 0604  TempSrc:   PainSc: 0-No pain      Patients Stated Pain Goal: 4 (06/05/18 0604)  Complications: No apparent anesthesia complications

## 2018-06-05 NOTE — Discharge Instructions (Signed)

## 2018-06-06 ENCOUNTER — Encounter (HOSPITAL_COMMUNITY): Payer: Self-pay | Admitting: Orthopedic Surgery

## 2018-06-06 DIAGNOSIS — E663 Overweight: Secondary | ICD-10-CM | POA: Diagnosis present

## 2018-06-06 LAB — BASIC METABOLIC PANEL
Anion gap: 5 (ref 5–15)
BUN: 19 mg/dL (ref 8–23)
CALCIUM: 8.2 mg/dL — AB (ref 8.9–10.3)
CO2: 27 mmol/L (ref 22–32)
CREATININE: 0.89 mg/dL (ref 0.61–1.24)
Chloride: 105 mmol/L (ref 98–111)
Glucose, Bld: 150 mg/dL — ABNORMAL HIGH (ref 70–99)
Potassium: 4.6 mmol/L (ref 3.5–5.1)
SODIUM: 137 mmol/L (ref 135–145)

## 2018-06-06 LAB — CBC
HCT: 34.5 % — ABNORMAL LOW (ref 39.0–52.0)
HEMOGLOBIN: 11.6 g/dL — AB (ref 13.0–17.0)
MCH: 32 pg (ref 26.0–34.0)
MCHC: 33.6 g/dL (ref 30.0–36.0)
MCV: 95 fL (ref 78.0–100.0)
PLATELETS: 205 10*3/uL (ref 150–400)
RBC: 3.63 MIL/uL — ABNORMAL LOW (ref 4.22–5.81)
RDW: 12.3 % (ref 11.5–15.5)
WBC: 12.5 10*3/uL — ABNORMAL HIGH (ref 4.0–10.5)

## 2018-06-06 NOTE — Progress Notes (Signed)
Patient discharged to home with family. Given all belongings, instructions, prescriptions. Patient and family verbalized understanding of instructions. Escorted to pov via w/c. 

## 2018-06-06 NOTE — Progress Notes (Signed)
Physical Therapy Treatment Patient Details Name: Roger [name] MRN: [id number] DOB: 1951/04/21 Today's Date: 06/06/2018    History of Present Illness R THA-DA    PT Comments    Pt ambulated 300' with RW, no loss of balance. Pt demonstrates understanding of HEP. Stair training completed. [place] recommended he stop hourly to walk. He is ready to DC home from PT standpoint.    Follow Up Recommendations  Follow surgeon's recommendation for DC plan and follow-up therapies     Equipment Recommendations  None recommended by PT    Recommendations for Other Services       Precautions / Restrictions Precautions Precautions: Fall Precaution Comments: denies falls in past 1 year Restrictions Weight Bearing Restrictions: No RLE Weight Bearing: Weight bearing as tolerated    Mobility  Bed Mobility               General bed mobility comments: up in recliner  Transfers Overall transfer level: Needs assistance Equipment used: Rolling walker (2 wheeled) Transfers: Sit to/from Stand Sit to Stand: Modified independent (Device/Increase time)         General transfer comment: used armrests  Ambulation/Gait Ambulation/Gait assistance: Modified independent (Device/Increase time) Gait Distance (Feet): 300 Feet Assistive device: Rolling walker (2 wheeled) Gait Pattern/deviations: Step-through pattern     General Gait Details: no loss of balance   Stairs Stairs: Yes Stairs assistance: Supervision Stair Management: No rails;Backwards;Step to pattern;With walker Number of Stairs: 1     Wheelchair Mobility    Modified Rankin (Stroke Patients Only)       Balance Overall balance assessment: Modified Independent                                          Cognition Arousal/Alertness: Awake/alert Behavior During Therapy: WFL for tasks assessed/performed Overall Cognitive Status: Within Functional Limits  for tasks assessed                                        Exercises Total Joint Exercises Ankle Circles/Pumps: AROM;15 reps;Both;Supine Short Arc Quad: AROM;Right;10 reps;Supine Heel Slides: Right;10 reps;Supine;AROM Hip ABduction/ADduction: Right;10 reps;Supine;AROM;Standing(10 supine, 10 standing) Long Arc Quad: AROM;Right;Seated;10 reps Marching in Standing: AROM;Right;10 reps;Standing Standing Hip Extension: AROM;Right;10 reps;Standing    General Comments        Pertinent Vitals/Pain Pain Score: 6  Pain Location: R hip Pain Descriptors / Indicators: Aching Pain Intervention(s): Limited activity within patient's tolerance;Monitored during session;Premedicated before session;Ice applied    Home Living                      Prior Function            PT Goals (current goals can now be found in the care plan section) Acute Rehab PT Goals Patient Stated Goal: to play his guitar, go to beach condo ASAP PT Goal Formulation: With patient Time For Goal Achievement: 06/12/18 Potential to Achieve Goals: Good Progress towards PT goals: Goals met/education completed, patient discharged from PT    Frequency    7X/week      PT Plan Current plan remains appropriate    Co-evaluation              AM-PAC PT "6 Clicks" Daily Activity  Outcome Measure  Difficulty turning over in bed (including adjusting bedclothes, sheets and blankets)?: A Little Difficulty moving from lying on back to sitting on the side of the bed? : A Little Difficulty sitting down on and standing up from a chair with arms (e.g., wheelchair, bedside commode, etc,.)?: A Little Help needed moving to and from a bed to chair (including a wheelchair)?: None Help needed walking in hospital room?: None Help needed climbing 3-5 steps with a railing? : A Little 6 Click Score: 20    End of Session Equipment Utilized During Treatment: Gait belt Activity Tolerance: Patient tolerated  treatment well Patient left: in chair;with call bell/phone within reach;with family/visitor present Nurse Communication: Mobility status PT Visit Diagnosis: Difficulty in walking, not elsewhere classified (R26.2);Pain Pain - Right/Left: Right Pain - part of body: Hip     Time: 5400-8676 PT Time Calculation (min) (ACUTE ONLY): 26 min  Charges:  $Gait Training: 8-22 mins $Therapeutic Exercise: 8-22 mins                    G Codes:         Philomena Doheny 06/06/2018, 9:59 AM [phone]

## 2018-06-06 NOTE — Progress Notes (Signed)
     Subjective: 1 Day Post-Op Procedure(s) (LRB): RIGHT TOTAL HIP ARTHROPLASTY ANTERIOR APPROACH (Right)   Patient reports pain as mild, pain controlled.  No events after changing some of his meds yesterday.  Uneventful night.   Looking forward to progressing in the recovery of the right hip.  Ready to be discharged home.   Objective:   VITALS:   Vitals:   06/06/18 0118 06/06/18 0523  BP: 119/63 117/67  Pulse: 89 81  Resp: 16 18  Temp: 98.3 F (36.8 C) 98.3 F (36.8 C)  SpO2: 96% 96%    Dorsiflexion/Plantar flexion intact Incision: dressing C/D/I No cellulitis present Compartment soft  LABS Recent Labs    06/06/18 0608  HGB 11.6*  HCT 34.5*  WBC 12.5*  PLT 205    Recent Labs    06/06/18 0608  NA 137  K 4.6  BUN 19  CREATININE 0.89  GLUCOSE 150*     Assessment/Plan: 1 Day Post-Op Procedure(s) (LRB): RIGHT TOTAL HIP ARTHROPLASTY ANTERIOR APPROACH (Right) Foley cath d/c'ed Advance diet Up with therapy D/C IV fluids Discharge home [facility] Follow up with OLIN,Tora Prunty D in 2 weeks.  Contact information:  [facility]) [address], [address] [place] [id number] [phone]    Overweight (BMI 25-29.9) Estimated body mass index is 29.84 kg/m as calculated from the following:   Height as of this encounter: 5\' 10"  (1.778 m).   Weight as of this encounter: 94.3 kg (208 lb). Patient also counseled that weight may inhibit the healing process Patient counseled that losing weight will help with future health issues        Anastasio AuerbachMatthew S. Jasa Dundon   PAC  06/06/2018, 9:01 AM

## 2018-06-08 NOTE — Discharge Summary (Signed)
Physician Discharge Summary  Patient ID: Roger [name] MRN: [id number] DOB/AGE: Jan 04, 1951 67 y.o.  Admit date: 06/05/2018 Discharge date: 06/06/2018   Procedures:  Procedure(s) (LRB): RIGHT TOTAL HIP ARTHROPLASTY ANTERIOR APPROACH (Right)  Attending Physician:  Dr. Durene Romans   Admission Diagnoses:   Right hip primary OA / pain  Discharge Diagnoses:  Principal Problem:   S/P right THA, AA Active Problems:   S/P hip replacement   Overweight (BMI 25.0-29.9)  Past Medical History:  Diagnosis Date  . Anginal pain (HCC)    "since 11/04/2015" (03/02/2016)  . Arthritis    "back" (03/02/2016)  . CAD (coronary artery disease)    a. Ant STEMI 10/2015 - s/p DES to LAD, residual RCA disease being treated medically.   Marland Kitchen CAD S/P percutaneous coronary angioplasty 11/04/2015   a. Ant STEMI 10/2015 - s/p DES to LAD , (Promus Premier DES 2.75 mm x 16 mm; 3.3 mm), residual ~50+% ulcerated RCA disease being treated medically.   . Chronic combined systolic and diastolic CHF (congestive heart failure) (HCC)    a. 2D echo 10/2015 at time of MI: EF 45-50%, grade 1 DD.  Marland Kitchen Chronic lower back pain   . Colon polyp   . Dressler's syndrome (HCC)    Post anterior STEMI pericarditis; resolved   . Headache    "sometimes daily; sometimes less often; no much now" (03/02/2016)  . Hyperglycemia    a. 10/2015 - A1C 6.5.  . Hyperlipidemia with target LDL less than 70   . Ischemic cardiomyopathy    a. 12/22 Echo: Post anterior STEMI EF 45-50%, followup echo 11/11/15 EF 40-45%.  . Kidney calculus    "passed"  . Pneumonia 2016  . S/P angioplasty with stent 03/02/16 mLAD angioplasty  03/03/2016  . ST elevation (STEMI) myocardial infarction involving left anterior descending coronary artery (HCC) 11/04/2015   LAD 100%,   . Sustained VT (ventricular tachycardia) (HCC)    a. Ischemic -- Had VT-VF during Cath for Anterior STEMI - Defib x 2  . Syncope and collapse 04/2016  . Tobacco abuse     HPI:     Roger [name], 67 y.o. male, has a history of pain and functional disability in the right hip(s) due to arthritis and patient has failed non-surgical conservative treatments for greater than 12 weeks to include NSAID's and/or analgesics and activity modification.  Onset of symptoms was gradual starting ~1 year ago with rapidlly worsening course since that time.The patient noted no past surgery on the right hip(s).  Patient currently rates pain in the right hip at 8 out of 10 with activity. Patient has worsening of pain with activity and weight bearing, trendelenberg gait, pain that interfers with activities of daily living and pain with passive range of motion. Patient has evidence of periarticular osteophytes and joint space narrowing by imaging studies. This condition presents safety issues increasing the risk of falls. There is no current active infection.  Risks, benefits and expectations were discussed with the patient.  Risks including but not limited to the risk of anesthesia, blood clots, nerve damage, blood vessel damage, failure of the prosthesis, infection and up to and including death.  Patient understand the risks, benefits and expectations and wishes to proceed with surgery.   PCP: Lewis Moccasin, MD   Discharged Condition: good  Hospital Course:  Patient underwent the above stated procedure on 06/05/2018. Patient tolerated the procedure well and brought to the recovery room in good condition and subsequently to the floor.  POD #1 BP: 117/67 ; Pulse: 81 ; Temp: 98.3 F (36.8 C) ; Resp: 18 Patient reports pain as mild, pain controlled.  No events after changing some of his meds yesterday.  Uneventful night.   Looking forward to progressing in the recovery of the right hip.  Ready to be discharged home. Dorsiflexion/plantar flexion intact, incision: dressing C/D/I, no cellulitis present and compartment soft.   LABS  Basename    HGB     11.6  HCT     34.5    Discharge  Exam: General appearance: alert, cooperative and no distress Extremities: Homans sign is negative, no sign of DVT, no edema, redness or tenderness in the calves or thighs and no ulcers, gangrene or trophic changes  Disposition:  Home with follow up in 2 weeks   Follow-up Information    Durene Romans, MD. Schedule an appointment as soon as possible for a visit in 2 weeks.   Specialty:  Orthopedic Surgery Contact information: [address] [place] [address] [phone]           Discharge Instructions    Call MD / Call 911   Complete by:  As directed    If you experience chest pain or shortness of breath, CALL 911 and be transported to the hospital emergency room.  If you develope a fever above 101 F, pus (white drainage) or increased drainage or redness at the wound, or calf pain, call your surgeon's office.   Change dressing   Complete by:  As directed    Maintain surgical dressing until follow up in the clinic. If the edges start to pull up, may reinforce with tape. If the dressing is no longer working, may remove and cover with gauze and tape, but must keep the area dry and clean.  Call with any questions or concerns.   Constipation Prevention   Complete by:  As directed    Drink plenty of fluids.  Prune juice may be helpful.  You may use a stool softener, such as Colace (over the counter) 100 mg twice a day.  Use MiraLax (over the counter) for constipation as needed.   Diet - low sodium heart healthy   Complete by:  As directed    Discharge instructions   Complete by:  As directed    Maintain surgical dressing until follow up in the clinic. If the edges start to pull up, may reinforce with tape. If the dressing is no longer working, may remove and cover with gauze and tape, but must keep the area dry and clean.  Follow up in 2 weeks at [facility]. Call with any questions or concerns.   Increase activity slowly as tolerated   Complete by:  As  directed    Weight bearing as tolerated with assist device (walker, cane, etc) as directed, use it as long as suggested by your surgeon or therapist, typically at least 4-6 weeks.   TED hose   Complete by:  As directed    Use stockings (TED hose) for 2 weeks on both leg(s).  You may remove them at night for sleeping.      Allergies as of 06/06/2018   No Known Allergies     Medication List    STOP taking these medications   acetaminophen 500 MG tablet Commonly known as:  TYLENOL     TAKE these medications   aspirin 81 MG EC tablet Take 1 tablet (81 mg total) by mouth daily. What changed:  when to take this   atorvastatin 20 MG tablet Commonly known as:  LIPITOR Take 1 tablet (20 mg total) by mouth every evening.   clopidogrel 75 MG tablet Commonly known as:  PLAVIX Take 1 tablet (75 mg total) by mouth daily. 1 tablet by mouth daily in the morning.   docusate sodium 100 MG capsule Commonly known as:  COLACE Take 1 capsule (100 mg total) by mouth 2 (two) times daily.   ezetimibe 10 MG tablet Commonly known as:  ZETIA Take 1 tablet (10 mg total) by mouth daily. What changed:  when to take this   ferrous sulfate 325 (65 FE) MG tablet Commonly known as:  FERROUSUL Take 1 tablet (325 mg total) by mouth 3 (three) times daily with meals.   HYDROcodone-acetaminophen 7.5-325 MG tablet Commonly known as:  NORCO Take 1-2 tablets by mouth every 4 (four) hours as needed for moderate pain.   methocarbamol 500 MG tablet Commonly known as:  ROBAXIN Take 1 tablet (500 mg total) by mouth every 6 (six) hours as needed for muscle spasms.   metoprolol tartrate 25 MG tablet Commonly known as:  LOPRESSOR Take 1 tablet (25 mg total) by mouth 2 (two) times daily.   NITROSTAT 0.4 MG SL tablet Generic drug:  nitroGLYCERIN PLACE 1 TABLET (0.4 MG TOTAL) UNDER THE TONGUE EVERY 5 (FIVE) MINUTESAS NEEDED FOR CHEST PAIN (UP TO 3 DOSES).   polyethylene glycol packet Commonly known as:   MIRALAX / GLYCOLAX Take 17 g by mouth 2 (two) times daily.   Vitamin D3 1000 units Caps Take 1,000 Units by mouth 2 (two) times daily.            Discharge Care Instructions  (From admission, onward)        Start     Ordered   06/06/18 0000  Change dressing    Comments:  Maintain surgical dressing until follow up in the clinic. If the edges start to pull up, may reinforce with tape. If the dressing is no longer working, may remove and cover with gauze and tape, but must keep the area dry and clean.  Call with any questions or concerns.   06/06/18 [id number]       Signed: Anastasio AuerbachMatthew S. Glendene Wyer   PA-C  06/08/2018, 4:28 PM

## 2018-07-03 DIAGNOSIS — E782 Mixed hyperlipidemia: Secondary | ICD-10-CM | POA: Diagnosis not present

## 2018-07-03 DIAGNOSIS — I2510 Atherosclerotic heart disease of native coronary artery without angina pectoris: Secondary | ICD-10-CM | POA: Diagnosis not present

## 2018-07-23 DIAGNOSIS — M87051 Idiopathic aseptic necrosis of right femur: Secondary | ICD-10-CM | POA: Diagnosis not present

## 2018-07-23 DIAGNOSIS — M25561 Pain in right knee: Secondary | ICD-10-CM | POA: Diagnosis not present

## 2018-07-23 DIAGNOSIS — Z4732 Aftercare following explantation of hip joint prosthesis: Secondary | ICD-10-CM | POA: Diagnosis not present

## 2018-08-22 MED ORDER — METOPROLOL TARTRATE 25 MG PO TABS: 25.0000 mg | ORAL_TABLET | Freq: Two times a day (BID) | ORAL | 2 refills | Status: DC

## 2018-09-04 DIAGNOSIS — Z96649 Presence of unspecified artificial hip joint: Secondary | ICD-10-CM | POA: Diagnosis not present

## 2018-09-04 DIAGNOSIS — Z96641 Presence of right artificial hip joint: Secondary | ICD-10-CM | POA: Diagnosis not present

## 2018-09-17 DIAGNOSIS — H40013 Open angle with borderline findings, low risk, bilateral: Secondary | ICD-10-CM | POA: Diagnosis not present

## 2018-09-17 DIAGNOSIS — H2513 Age-related nuclear cataract, bilateral: Secondary | ICD-10-CM | POA: Diagnosis not present

## 2018-09-21 DIAGNOSIS — Z7902 Long term (current) use of antithrombotics/antiplatelets: Principal | ICD-10-CM

## 2018-09-21 DIAGNOSIS — Z5181 Encounter for therapeutic drug level monitoring: Secondary | ICD-10-CM

## 2018-09-21 MED ORDER — EZETIMIBE 10 MG PO TABS: 10.0000 mg | ORAL_TABLET | Freq: Every day | ORAL | 2 refills | Status: DC

## 2018-09-21 MED ORDER — ATORVASTATIN CALCIUM 20 MG PO TABS: 20.0000 mg | ORAL_TABLET | Freq: Every evening | ORAL | 2 refills | Status: DC

## 2018-09-21 MED ORDER — CLOPIDOGREL BISULFATE 75 MG PO TABS: 75.0000 mg | ORAL_TABLET | Freq: Every day | ORAL | 2 refills | Status: DC

## 2018-09-22 DIAGNOSIS — Z23 Encounter for immunization: Secondary | ICD-10-CM | POA: Diagnosis not present

## 2019-01-03 DIAGNOSIS — Z125 Encounter for screening for malignant neoplasm of prostate: Secondary | ICD-10-CM | POA: Diagnosis not present

## 2019-01-03 DIAGNOSIS — E782 Mixed hyperlipidemia: Secondary | ICD-10-CM | POA: Diagnosis not present

## 2019-01-03 DIAGNOSIS — R7301 Impaired fasting glucose: Secondary | ICD-10-CM | POA: Diagnosis not present

## 2019-01-03 DIAGNOSIS — I2510 Atherosclerotic heart disease of native coronary artery without angina pectoris: Secondary | ICD-10-CM | POA: Diagnosis not present

## 2019-01-28 DIAGNOSIS — I2510 Atherosclerotic heart disease of native coronary artery without angina pectoris: Secondary | ICD-10-CM | POA: Diagnosis not present

## 2019-01-28 DIAGNOSIS — I252 Old myocardial infarction: Secondary | ICD-10-CM | POA: Diagnosis not present

## 2019-01-28 DIAGNOSIS — E782 Mixed hyperlipidemia: Secondary | ICD-10-CM | POA: Diagnosis not present

## 2019-05-13 ENCOUNTER — Other Ambulatory Visit: Payer: Self-pay | Admitting: Cardiovascular Disease

## 2019-05-13 NOTE — Telephone Encounter (Signed)
Rx(s) sent to pharmacy electronically.  

## 2019-06-11 ENCOUNTER — Other Ambulatory Visit: Payer: Self-pay | Admitting: Cardiovascular Disease

## 2019-06-11 DIAGNOSIS — Z7902 Long term (current) use of antithrombotics/antiplatelets: Secondary | ICD-10-CM

## 2019-06-11 DIAGNOSIS — Z5181 Encounter for therapeutic drug level monitoring: Secondary | ICD-10-CM

## 2019-08-03 ENCOUNTER — Other Ambulatory Visit: Payer: Self-pay | Admitting: Cardiovascular Disease

## 2019-08-03 DIAGNOSIS — Z7902 Long term (current) use of antithrombotics/antiplatelets: Secondary | ICD-10-CM

## 2019-08-03 DIAGNOSIS — Z5181 Encounter for therapeutic drug level monitoring: Secondary | ICD-10-CM

## 2019-08-04 ENCOUNTER — Other Ambulatory Visit: Payer: Self-pay | Admitting: Cardiovascular Disease

## 2019-09-30 ENCOUNTER — Other Ambulatory Visit: Payer: Self-pay | Admitting: Cardiovascular Disease

## 2019-09-30 DIAGNOSIS — Z5181 Encounter for therapeutic drug level monitoring: Secondary | ICD-10-CM

## 2019-11-25 ENCOUNTER — Other Ambulatory Visit: Payer: Self-pay | Admitting: Cardiovascular Disease

## 2019-11-25 DIAGNOSIS — Z7902 Long term (current) use of antithrombotics/antiplatelets: Secondary | ICD-10-CM

## 2019-11-25 DIAGNOSIS — Z5181 Encounter for therapeutic drug level monitoring: Secondary | ICD-10-CM
